# Patient Record
Sex: Male | Born: 1940 | Race: White | Hispanic: No | Marital: Married | Smoking: Never smoker
Health system: Southern US, Community
[De-identification: ages and names within clinical notes are randomized; demographics above are authoritative.]

## PROBLEM LIST (undated history)

## (undated) DIAGNOSIS — I712 Thoracic aortic aneurysm, without rupture, unspecified: Secondary | ICD-10-CM

## (undated) DIAGNOSIS — I10 Essential (primary) hypertension: Secondary | ICD-10-CM

## (undated) DIAGNOSIS — C61 Malignant neoplasm of prostate: Secondary | ICD-10-CM

## (undated) DIAGNOSIS — J45909 Unspecified asthma, uncomplicated: Secondary | ICD-10-CM

## (undated) DIAGNOSIS — K219 Gastro-esophageal reflux disease without esophagitis: Secondary | ICD-10-CM

## (undated) DIAGNOSIS — F419 Anxiety disorder, unspecified: Secondary | ICD-10-CM

## (undated) HISTORY — DX: Gastro-esophageal reflux disease without esophagitis: K21.9

## (undated) HISTORY — DX: Anxiety disorder, unspecified: F41.9

## (undated) HISTORY — DX: Thoracic aortic aneurysm, without rupture: I71.2

## (undated) HISTORY — DX: Thoracic aortic aneurysm, without rupture, unspecified: I71.20

## (undated) HISTORY — PX: CATARACT EXTRACTION W/ INTRAOCULAR LENS  IMPLANT, BILATERAL: SHX1307

## (undated) HISTORY — DX: Unspecified asthma, uncomplicated: J45.909

---
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
## 1986-11-21 HISTORY — PX: WISDOM TOOTH EXTRACTION: SHX21

## 2001-03-13 ENCOUNTER — Other Ambulatory Visit: Admission: RE | Admit: 2001-03-13 | Discharge: 2001-03-13 | Payer: Self-pay | Admitting: Urology

## 2001-03-13 ENCOUNTER — Encounter (INDEPENDENT_AMBULATORY_CARE_PROVIDER_SITE_OTHER): Payer: Self-pay

## 2002-11-21 HISTORY — PX: MOUTH SURGERY: SHX715

## 2003-05-01 ENCOUNTER — Encounter: Admission: RE | Admit: 2003-05-01 | Discharge: 2003-06-23 | Payer: Self-pay | Admitting: Family Medicine

## 2003-11-22 HISTORY — PX: PROSTATE SURGERY: SHX751

## 2003-11-22 HISTORY — PX: RETINAL DETACHMENT SURGERY: SHX105

## 2003-11-22 HISTORY — PX: RETROPUBIC PROSTATECTOMY: SUR1055

## 2004-06-10 ENCOUNTER — Ambulatory Visit (HOSPITAL_COMMUNITY): Admission: RE | Admit: 2004-06-10 | Discharge: 2004-06-11 | Payer: Self-pay | Admitting: Ophthalmology

## 2004-11-01 ENCOUNTER — Encounter (INDEPENDENT_AMBULATORY_CARE_PROVIDER_SITE_OTHER): Payer: Self-pay | Admitting: *Deleted

## 2004-11-01 ENCOUNTER — Inpatient Hospital Stay (HOSPITAL_COMMUNITY): Admission: RE | Admit: 2004-11-01 | Discharge: 2004-11-03 | Payer: Self-pay | Admitting: Urology

## 2007-11-22 HISTORY — PX: EYE SURGERY: SHX253

## 2007-12-05 ENCOUNTER — Ambulatory Visit (HOSPITAL_COMMUNITY): Admission: RE | Admit: 2007-12-05 | Discharge: 2007-12-05 | Payer: Self-pay | Admitting: Ophthalmology

## 2010-01-18 ENCOUNTER — Encounter: Payer: Self-pay | Admitting: Cardiology

## 2010-01-31 ENCOUNTER — Encounter: Payer: Self-pay | Admitting: Cardiology

## 2010-02-18 DIAGNOSIS — I10 Essential (primary) hypertension: Secondary | ICD-10-CM | POA: Insufficient documentation

## 2010-02-18 DIAGNOSIS — H3320 Serous retinal detachment, unspecified eye: Secondary | ICD-10-CM | POA: Insufficient documentation

## 2010-02-19 ENCOUNTER — Ambulatory Visit: Payer: Self-pay | Admitting: Cardiology

## 2010-02-19 DIAGNOSIS — E785 Hyperlipidemia, unspecified: Secondary | ICD-10-CM

## 2010-02-19 DIAGNOSIS — R072 Precordial pain: Secondary | ICD-10-CM | POA: Insufficient documentation

## 2010-03-04 ENCOUNTER — Ambulatory Visit (HOSPITAL_COMMUNITY): Admission: RE | Admit: 2010-03-04 | Discharge: 2010-03-04 | Payer: Self-pay | Admitting: Urology

## 2010-03-08 ENCOUNTER — Ambulatory Visit: Payer: Self-pay

## 2010-03-08 ENCOUNTER — Ambulatory Visit: Payer: Self-pay | Admitting: Cardiology

## 2010-03-09 ENCOUNTER — Ambulatory Visit: Admission: RE | Admit: 2010-03-09 | Discharge: 2010-06-07 | Payer: Self-pay | Admitting: Orthopedic Surgery

## 2010-03-25 ENCOUNTER — Ambulatory Visit: Payer: Self-pay | Admitting: Cardiology

## 2010-04-05 ENCOUNTER — Encounter: Payer: Self-pay | Admitting: Cardiology

## 2010-06-08 ENCOUNTER — Ambulatory Visit: Admission: RE | Admit: 2010-06-08 | Discharge: 2010-07-22 | Payer: Self-pay | Admitting: Radiation Oncology

## 2010-12-23 NOTE — Miscellaneous (Signed)
Summary: ambulatory BP monitor per Dr Julienne Kass  Clinical Lists Changes  Orders: Added new Referral order of Ambulatory Bloodpressure Cuff (Amb BP Cuff) - Signed

## 2010-12-23 NOTE — Assessment & Plan Note (Signed)
Summary: np6/chest pain/jml   Visit Type:  Follow-up Primary Provider:  Dr. Carmell Austria  CC:  chest pain.  History of Present Illness: The patient presents for new patient evaluation. He has no prior cardiac history. However, he does have cardiovascular risk factors. He has chest pain that has been going on for at least a year or more. This is sporadic. He doesn't necessarily come with exercise. However, up with exercise (he walks 2 miles almost every day), he might get this discomfort. He will typically feels somewhat short of breath with the sensation that he can't get a full breath. This has been a stable pattern. He has ascribed to his asthma in the past. However, now he is also getting some sensation of this at night in his house at times. He has this chest discomfort that feels like "restricted breathing". He does not describe neck or arm discomfort. He does not describe palpitations, presyncope or syncope. He does not have PND or orthopnea. He does not have swelling or weight gain. She has had some dyslipidemia which he treats with diet and has had improvement over the years. He does have mild glucose intolerance.  Current Medications (verified): 1)  Lisinopril-Hydrochlorothiazide 10-12.5 Mg Tabs (Lisinopril-Hydrochlorothiazide) .Marland Kitchen.. 1 By Mouth Daily 2)  Proair Hfa 108 (90 Base) Mcg/act Aers (Albuterol Sulfate) .... As Needed 3)  Flonase 50 Mcg/act Susp (Fluticasone Propionate) .... As Needed 4)  Claritin 10 Mg Tabs (Loratadine) .... As Needed 5)  Opcon-A 0.027-0.315 % Soln (Naphazoline-Pheniramine) .... 2-4 Times Daily  Allergies (verified): 1)  ! * Codiene  Past History:  Past Medical History:  1.  Hypertension.  2.  Seasonal allergies.  3.  Status post detached retina.  4.  GERD  5.  Prostate CA.  Past Surgical History: Status post repair of detached retina. Bone graft in the jaw Prostatectomy Cataract extraction (multiple eye surgeries)  Family History: Positive for  prostate cancer, hypertension, stomach cancer, leukemia.  There  was no early-onset heart disease. His mother died of heart failure at 29.  Vital Signs:  Patient profile:   70 year old male Height:      69 inches Weight:      192 pounds BMI:     28.46 Pulse rate:   68 / minute Resp:     18 per minute BP sitting:   168 / 70  (right arm)  Vitals Entered By: Marrion Coy, CNA (February 19, 2010 11:21 AM)  Physical Exam  General:  Well developed, well nourished, in no acute distress. Head:  normocephalic and atraumatic Eyes:  PERRLA/EOM intact; conjunctiva and lids normal. Mouth:  Teeth, gums and palate normal. Oral mucosa normal. Neck:  Neck supple, no JVD. No masses, thyromegaly or abnormal cervical nodes. Chest Wall:  no deformities or breast masses noted Lungs:  Clear bilaterally to auscultation and percussion. Abdomen:  Bowel sounds positive; abdomen soft and non-tender without masses, organomegaly, or hernias noted. No hepatosplenomegaly. Msk:  Back normal, normal gait. Muscle strength and tone normal. Extremities:  No clubbing or cyanosis. Neurologic:  Alert and oriented x 3. Skin:  Intact without lesions or rashes. Cervical Nodes:  no significant adenopathy Axillary Nodes:  no significant adenopathy Inguinal Nodes:  no significant adenopathy Psych:  Normal affect.   Detailed Cardiovascular Exam  Neck    Carotids: Carotids full and equal bilaterally without bruits.      Neck Veins: Normal, no JVD.    Heart    Inspection: no deformities or lifts  noted.      Palpation: normal PMI with no thrills palpable.      Auscultation: regular rate and rhythm, S1, S2 without murmurs, rubs, gallops, or clicks.    Vascular    Abdominal Aorta: no palpable masses, pulsations, or audible bruits.      Femoral Pulses: normal femoral pulses bilaterally.      Pedal Pulses: normal pedal pulses bilaterally.      Radial Pulses: normal radial pulses bilaterally.      Peripheral Circulation: no  clubbing, cyanosis, or edema noted with normal capillary refill.     EKG  Procedure date:  01/18/2010  Findings:      Normal sinus rhythm, rate 67, axis within normal limits, intervals within normal limits, no acute ST-T wave changes.  Impression & Recommendations:  Problem # 1:  CHEST PAIN, PRECORDIAL (ICD-786.51) The patient has chest pain that is somewhat atypical it maybe related more to asthma. However, he does have risk factors. Of note he had an elevated myeloperoxidase which can be an indicator of vascular disease. At this point I think screening with a plano exercise treadmill (POET) is reasonable as it will allow me to rule out obstructive coronary disease for which there was a low pretest probability, risk stratify and give him a prescription for exercise. Orders: Treadmill (Treadmill)  Problem # 2:  HYPERTENSION (ICD-401.9) His blood pressure is well controlled. He has recently had his meds changed and I think this is reasonable. He asked about taking his lisinopril HCT b.i.d. and I suggested not to because of the diuretic which he would not want to take later in the day.  Problem # 3:  DYSLIPIDEMIA (ICD-272.4) I reviewed his past and recent lipid profiles. He has been a very nice job over the years with diet bringing his LDL down. He still not below 100 however. I agree that he does not need pharmacologic management of this but I suggested further dietary changes that might help him reach an even better ratio. We will also discuss exercise at the time of his treadmill.  Patient Instructions: 1)  Your physician recommends that you schedule a follow-up appointment at the time of treadmill 2)  Your physician recommends that you continue on your current medications as directed. Please refer to the Current Medication list given to you today. 3)  Your physician has requested that you have an exercise tolerance test.  For further information please visit [email or web address]  Please  also follow instruction sheet, as given.

## 2010-12-23 NOTE — Letter (Signed)
Summary: [facility]  [facility]   Imported By: Kassie Mends 04/05/2010 08:09:30  _____________________________________________________________________  External Attachment:    Type:   Image     Comment:   External Document

## 2010-12-23 NOTE — Procedures (Signed)
Summary: summary  report  summary  report   Imported By: Mirna Mires 04/12/2010 14:54:15  _____________________________________________________________________  External Attachment:    Type:   Image     Comment:   External Document

## 2010-12-23 NOTE — Cardiovascular Report (Signed)
Summary: Ambulatory Blood Pressure Report   Ambulatory Blood Pressure Report   Imported By: Erle Crocker 04/23/2010 10:10:08  _____________________________________________________________________  External Attachment:    Type:   Image     Comment:   External Document

## 2011-04-05 NOTE — Op Note (Signed)
[name], [name]                ACCOUNT NO.:  [id number]   MEDICAL RECORD NO.:  [id number]          PATIENT TYPE:  AMB   LOCATION:  SDS                          FACILITY:  [facility]   PHYSICIAN:  Jillyn Hidden A. Rankin, M.D.   DATE OF BIRTH:  [birth year]   DATE OF PROCEDURE:  12/05/2007  DATE OF DISCHARGE:                               OPERATIVE REPORT   PREOPERATIVE DIAGNOSIS:  Rhegmatogenous retinal detachment, left eye,  macula on.   POSTOPERATIVE DIAGNOSIS:  Rhegmatogenous retinal detachment, left eye,  macula on.   PROCEDURES:  1. Scleral buckle using 287 explant.  2. Retinal cryopexy, left eye.   SURGEON:  Alford Highland. Rankin, M.D.   ANESTHESIA:  Local retrobulbar with monitored anesthesia control.   INDICATIONS FOR PROCEDURE:  The patient is a 70 year old man who has a  history of retinal detachment of the right eye, who presents with a  largely asymptomatic non-macula-involving but macula-threatening retinal  detachment superotemporally in the left eye.  The patient understands  this is an attempt to reattach the retina.  He understands the risks of  anesthesia including the rare occurrence of death and looses to the eye  including but not limited to hemorrhage, infection, scarring, need for  further surgery, no change in vision, loss of vision, progression of  disease despite intervention.  He understands the need for surgical  intervention to prevent vision loss and progression of the retinal  detachment.  The understands these issues and wishes to proceed.   The patient was taken to the operating room, where mild sedation was  introduced.  Marcaine 0.75% delivered retrobulbar 5 mL followed by an  additional 5 mL laterally in the fashion of modified Darel Hong.  The left  periocular region was sterilely prepped and draped in the usual  ophthalmic sterile fashion.  A lid speculum applied.  A conjunctival  peritomy was then fashioned from the 11 o'clock position temporally down  to  the 5 o'clock position.  The lateral rectus and superior rectus  muscles were isolated.  Notable findings of a Tenon's dissection  required over the surface of the lateral rectus as well as in the  superotemporal quadrant because of apparent scarring in this region of  the epibulbar tissues.   Nonetheless, ophthalmoscopy was performed.  Cryopexy was placed along  the 3 o'clock position along the border of what appeared to be an old  cryo scar from previous treatment, and also along the ora serrata in the  bed of the detachment up to the 1 o'clock position.  The extent of the  detachment was from 1 o'clock to 3 o'clock.   At 287 explant was selected and secured in this superotemporal quadrant  using 5-0 Mersilene mattress sutures.  With the passage of the suture,  spontaneous drainage of the fluid was carried out without difficulty.  Retinal cryopexy was placed in this area.   This allowed for excellent scleral indentation and placement of the  buckle.  The scleral buckle was tied in the appropriate height and  tension.  The edges were trimmed.  Conjunctiva and Tenon's  was brought  forward and closed after confirmatory ophthalmoscopy confirmed that the  retina was completely reattached with excellent scleral support.   At this time the bed of the conjunctiva and Tenon's was brought forward  and closed with interrupted 7-0 Vicryl.  Subconjunctival Decadron  applied.  A sterile patch and Fox shield applied.  The patient taken to  the PACU in good condition.      Alford Highland Rankin, M.D.  Electronically Signed     GAR/MEDQ  D:  12/05/2007  T:  12/05/2007  Job:  [id number]

## 2011-04-08 NOTE — H&P (Signed)
NAME:  [name], [name]                          ACCOUNT NO.:  [id number]   MEDICAL RECORD NO.:  [id number]                   PATIENT TYPE:  OIB   LOCATION:  5725                                 FACILITY:  [facility]   PHYSICIAN:  Guadelupe Sabin, M.D.             DATE OF BIRTH:  03-Feb-1941   DATE OF ADMISSION:  06/10/2004  DATE OF DISCHARGE:                                HISTORY & PHYSICAL   HISTORY OF PRESENT ILLNESS:  This was an urgent outpatient admission of this  70 year old, white male admitted with a rhegmatogenous retinal detachment of  the right eye.  This patient was in good visual health until a few days  prior to admission when he began to the note onset of floaters and a  progressive visual field defect in his right eye.  The patient called his  regular ophthalmologist, Dr. Glenford Peers, who referred the patient to my  office.  A diagnosis of a retinal detachment was made and arrangements made  for his outpatient admission at this time.   PAST MEDICAL HISTORY:  The patient is under the care of Dr. Quita Skye. Kindl  and takes several medications under his direction for hypertension including  lisinopril, Norvasc and hydrochlorothiazide.   ALLERGIES:  HYDROCODONE.   PHYSICAL EXAMINATION:  VITAL SIGNS:  As recorded.  GENERAL:  The patient is a pleasant, well-developed, well-nourished, white  male in acute ocular distress.  HEENT:  Visual acuity less than 20/400 in right eye, 20/25 in left eye.  Applanation tonometry normal both eyes.  Slit lamp examination shows the  eyes wide and clear with a clear cornea and clear lens.  Dilated fundus  examination with right eye revealing a relatively clear vitreous with a  bullous rhegmatogenous retinal detachment in the temporal quadrants.  There  are two round atrophic holes noted at the 9:30 o'clock position.  The  macular area is detached.  The optic nerve is sharply outlined of good color  with disc/cup ratio 0.3.  Blood vessels are  normal, macular detached.  Left  eye with clear vitreous, attached retina, normal optic nerve, blood vessels  and macula.  Refractive air, the patient is high myope in both eyes,  approximately 7.00 in each eye.  CHEST:  Lungs clear to auscultation and percussion.  HEART:  Normal sinus rhythm.  No cardiomegaly.  No murmurs.  ABDOMEN:  Negative.  EXTREMITIES:  Negative.   ASSESSMENT:  Rhegmatogenous retinal detachment, right eye, multiple tears.   PLAN:  Scleral buckling procedure with possible vitrectomy.  The patient has  been given, with his wife, printed material concerning retinal detachment  surgery and its possible complications, reoperations and failure to reattach  the retina.  They have elected to proceed to stay in [facility] for hospital  surgery at this time.  Guadelupe Sabin, M.D.    HNJ/MEDQ  D:  06/10/2004  T:  06/11/2004  Job:  [id number]

## 2011-04-08 NOTE — Op Note (Signed)
NAME:  [name], [name]                          ACCOUNT NO.:  [id number]   MEDICAL RECORD NO.:  [id number]                   PATIENT TYPE:  OIB   LOCATION:  5725                                 FACILITY:  [facility]   PHYSICIAN:  Guadelupe Sabin, M.D.             DATE OF BIRTH:  1941-11-20   DATE OF PROCEDURE:  06/10/2004  DATE OF DISCHARGE:                                 OPERATIVE REPORT   PREOPERATIVE DIAGNOSIS:  Rhegmatogenous retinal detachment right eye.   POSTOPERATIVE DIAGNOSIS:  Rhegmatogenous retinal detachment right eye.   PROCEDURE:  Scleral buckling procedure right eye using solid silicone  implants #277 and 240, cryo application, diathermy application, external  drainage of subretinal fluid.   SURGEON:  Guadelupe Sabin, M.D.   ASSISTANT:  Nurse.   ANESTHESIA:  General anesthesia.   OPHTHALMOSCOPY:  As previously described.   DESCRIPTION OF PROCEDURE:  After the patient was prepped and draped, lid  traction sutures were placed in the right upper and lower lids.  A lid  speculum was inserted.  A peritomy was performed adjacent to the limbus 360  degrees.  The subconjunctival tissue was cleaned and the rectus muscles  isolated with 4-0 silk traction sutures.  The sclera was inspected and felt  to be in satisfactory condition for lamellar scleral dissection on the  temporal quadrants.  Localization was then carried out with the indirect  ophthalmoscope and retinal cryoprobe.  The two retinal holes were located at  the 9:30 position, approximately 13 mm from the limbus.  The holes were  treated with direct cryo application.  It was then elected to perform a  lamellar scleral dissection from the 11 o'clock to 8 o'clock position.  The  bed measuring 9 mm in width.  Light cryo applications were applied.  A total  of three sutures were used to close the scleral flaps over a trimmed #277  solid silicone implant.  A #240 encircling band was placed about the globe,  tied with  two sutures at the 2 o'clock position.  Anchoring sutures were  placed at the 2 and 4 o'clock position of 5-0 white Dacron.  After repeat  indirect ophthalmoscopy, it was elected to drain fluid in the bed at the  8:30 position.  Incision was made through the inner scleral lamella.  The  choroid treated with light diathermy and then perforated with the pin  electrode.  An abundant amount of clear, slightly yellow tinged inviscid  fluid drained.  The scleral flaps were pulled up creating a scleral buckling  indentation and the tension of the encircling band adjusted.  Repeat  indirect ophthalmoscopy showed flattening of the retina on the implant  surface with good positioning at the holes.  It was therefore, elected to  close.  The tension of the encircling band was checked and the scleral flaps  secured permanently.  Tenon's capsule was pulled forward in the four  quadrants and tied as a separate layer with a 6-0 chromic catgut suture.  Neosporin was irrigated in the subtenon space.  The conjunctiva was pulled  forward and closed with a running 6-0 chromic catgut suture.  Depo-Garamycin  and dexamethasone were injected in the  subtenon space inferiorly.  Maxitrol and atropine ointment were instilled in  the conjunctival cul-de-sac.  Light patch and protector shield were applied  to the operated right eye.  Duration of procedure was one and a half hours.  The patient tolerated the procedure well in general and left the operating  room for the recovery room in good condition.                                               Guadelupe Sabin, M.D.    HNJ/MEDQ  D:  06/10/2004  T:  06/11/2004  Job:  [id number]   cc:   Delon Sacramento, M.D.  [address], [address]  [place]  [place] [address]  Fax: [phone]   Quita Skye. Artis Flock, M.D.  [address]  [place]  [place] [address]  Fax: [phone]

## 2011-04-08 NOTE — Op Note (Signed)
[name], [name]                ACCOUNT NO.:  [id number]   MEDICAL RECORD NO.:  [id number]          PATIENT TYPE:  INP   LOCATION:  0354                         FACILITY:  [facility]   PHYSICIAN:  Bertram Millard. Dahlstedt, M.D.DATE OF BIRTH:  Mar 24, 1941   DATE OF PROCEDURE:  11/01/2004  DATE OF DISCHARGE:                                 OPERATIVE REPORT   PREOPERATIVE DIAGNOSIS:  Prostate cancer (Gleason's score 4 + 3 = 7,  prostate-specific antigen equal 5).   POSTOPERATIVE DIAGNOSIS:  Prostate cancer (Gleason's score 4 + 3 = 7,  prostate-specific antigen equal 5).   PROCEDURE:  Radical retropubic prostatectomy with bilateral pelvic lymph  node dissection.   ATTENDING SURGEON:  Bertram Millard. Retta Diones, M.D.   RESIDENT SURGEON:  Rhae Lerner, M.D.   ANESTHESIA:  General endotracheal anesthesia.   COMPLICATIONS:  None.   ESTIMATED BLOOD LOSS:  1200 mL.   INDICATION FOR PROCEDURE:  Mr. Lange is a 70 year old male with a past  medical history significant for an abnormal digital rectal examination,  status post a negative workup and biopsy for prostate cancer in 2002.  Mr.  Shannahan was subsequently lost to followup, however, he was reevaluated in  October of 2005 for prostate cancer with a persistently abnormal rectal  examination and a PSA of 5.1.  Transrectal ultrasound-guided biopsy this  time demonstrated Gleason's 4 + 3 = 7 prostate cancer on the left side.  After a long discussion with Mr. Shealy concerning the implications of  prostate adenocarcinoma as well as possible treatment options, he has  elected to proceed with radical retropubic prostatectomy and bilateral  pelvic lymph node dissection.   DESCRIPTION OF THE PROCEDURE IN DETAIL:  Patient was brought to the  operating room and following induction of general endotracheal anesthesia,  he was placed in the supine position and prepped and draped in the usual  sterile fashion.  A 22-French Foley catheter was placed to  straight drain  and the bladder emptied.  A low midline incision was subsequently performed  from a point just below the umbilicus to a point immediately overlying the  pubic bone.  Dissection was carried down through the anterior rectus fascia.  The rectus muscles were separated and the space of Retzius entered sharply  with Metzenbaum scissors.  The incision was subsequently completely opened  along its entire length.  The space of Retzius was then completely developed  bluntly.  A Bookwalter retractor was placed at this point with the retractor  blade set for maximal exposure of the left pelvic lymph nodes.  A left  pelvic lymph node dissection was subsequently performed and the lymph node  packet was sent for pathology.  Several small veins were identified during  the dissection and these were carefully clipped and divided.  The retractor  blades were subsequently reset for exposure of the right pelvic lymph nodes.  A right pelvic lymphadenectomy was subsequently performed, again using a  combination of blunt dissection and careful clipping of small venous  structures.  The right lymph node packet was also sent for pathology.  Once  the lymphadenectomy had been completed, the retractor blades were reset for  maximal exposure of the anterior surface of the bladder and prostate.  All  adherent tissues were carefully teased away from the anterior surface of the  prostate.  The endopelvic fascia was then entered sharply on either side of  the prostate and the space developed between the lateral surface of the  prostate and the pelvic sidewall.  The puboprostatic ligaments were  identified and sharply divided using Metzenbaum scissors.  A Hohenfellner  clamp was subsequently passed between the anterior urethra and the overlying  dorsal venous complex at the apex of the prostate.  The clamp was  subsequently used to pass a #1 Vicryl through this space and the dorsal  venous complex was  ligated.  A second #1 Vicryl was passed through this  space in the same fashion and again used to ligated the dorsal venous  complex.  A #1 Vicryl suture was subsequently used to perform a stick tie on  the dorsal venous complex just proximal to the 2 previously placed ties.  A  2-0 Vicryl was subsequently used to place a figure-of-eight suture on the  anterior bladder neck for control of back-bleeding.  The dorsal venous  complex was subsequently divided using Bovie cautery, taking care to remain  distal to the apex of the prostate.  Once the dorsal venous complex had been  divided, a small hemorrhaging vessel was noted at the anterolateral aspect  of the dorsal venous complex on the right.  This was controlled with a 2-0  chromic figure-of-eight suture.  The anterior urethra was at this point  opened sharply using Metzenbaum scissors.  The Foley catheter was  subsequently identified within the urethra.  Metzenbaum scissors were used  to carefully tease the neurovascular bundles away from the urethra  bilaterally.  A right-angle clamp was subsequently passed posterior to the  urethra and used to bring a piece of umbilical tape completely around the  urethra.  The Foley catheter was subsequently apprehended using a Kelly  clamp, the catheter cut just proximal to the valve stem and the Foley  catheter brought into the operative field to use for superior retraction.  The posterior urethra was subsequently divided using Metzenbaum scissors.  At this point, a plane was developed between the posterior surface of the  prostate and the anterior surface of the rectum.  The rectourethralis muscle  was subsequently divided bilaterally by applying Hemoclips to the strands of  rectourethralis muscle and dividing the muscle using Metzenbaum scissors.  Once the posterior surface of the prostate had been completely dissected away from the anterior surface of the rectum, attention was turned to the   anterior bladder neck.  The bladder neck was carefully teased away from the  base of the prostate using a combination of blunt dissection with a  Vanderbilt and careful Bovie cautery.  The bladder neck was subsequently  opened anteriorly and the urine completely drained from the bladder.  The  Foley balloon was subsequently deflated and clamped together with the distal  end of the Foley to provide maximal retraction of the prostate.  The  posterior bladder neck was subsequently divided again using Vanderbilts to  tease apart the muscle fibers and careful division of muscle bundles using  Bovie cautery.  The ampullae of the vas deferens were identified, clipped  and divided.  The seminal vesicles were subsequently divided after  dissecting them away from the surrounding perirectal tissues.  Specimen was  subsequently delivered out of the operative field and sent for pathology.  The wound was subsequently irrigated and hemostasis obtained with placement  of a few additional Hemoclips and careful cauterization.  The bladder neck  was subsequently tailored using a tennis racket procedure with 4-0 chromic  suture.  The bladder mucosa was subsequently everted at the newly created  bladder neck.  The urethral anastomotic sutures were subsequently placed at  the 2 o'clock, 5 o'clock, 7 o'clock, 10 o'clock and 12 o'clock positions  using interrupted 2-0 Monocryl suture.  A new 22-French Foley catheter was  passed through the patient's urethra and brought into the pelvis.  The Foley  was subsequently placed within the bladder and balloon inflated.  Prior to  completing the anastomosis, final inspection of the operative field was made  and a small mucosal defect was noted in the bladder wall.  This was closed  with a single interrupted 2-0 chromic suture.  The bladder was subsequently  brought down adjacent to the urethra and the anastomotic sutures tied down  to complete the anastomosis.  All  retractor blades and lap sponges were  subsequently removed from the field and the wound irrigated.  A JP drain was  placed in the right lower quadrant through a separate stab incision and  sutured into place using a 3-0 nylon suture.  The rectus fascia was  subsequently reapproximated using a running #1 PDS suture.  The skin was  then reapproximated using 4-0 PDS subcuticular suture.  Benzoin and Steri-  Strips were applied to the suture line and the case was ended.  The patient  tolerated the procedure well and there were no complications.   COMMENT:  Please note that Dr. Marcine Matar was present for the entire  case and participated in all aspects of the procedure.     Jose   JJP/MEDQ  D:  11/01/2004  T:  11/01/2004  Job:  [id number]

## 2011-04-08 NOTE — H&P (Signed)
NAMEANGUEL, DELAPENA                ACCOUNT NO.:  [id number]   MEDICAL RECORD NO.:  [id number]          PATIENT TYPE:  INP   LOCATION:  NA                           FACILITY:  [facility]   PHYSICIAN:  Bertram Millard. Dahlstedt, M.D.DATE OF BIRTH:  [birth year]   DATE OF ADMISSION:  DATE OF DISCHARGE:                                HISTORY & PHYSICAL   CHIEF COMPLAINT:  Prostate cancer.   HISTORY OF PRESENT ILLNESS:  Mr. Paschal is a 70 year old gentleman with a  past medical history significant for a suspicious prostate nodule status  post negative biopsy in April of 2002.  Following this result, Mr. Deems was  lost to followup for several years; however, he presented for a PSA check  and digital rectal examination in October of 2005.  At that time, Mr. Feldner  continued to have a suspicious nodule on the left side of his prostate.  In  addition, his PSA was elevated at 5.3.  Due to these suspicious findings as  well as a positive family history of prostate cancer, Mr. 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  After discussing the situation with Mr. Zaugg and the  possible alternatives for treatment, he has elected to proceed with radical  retropubic prostatectomy.   REVIEW OF SYSTEMS:  The patient denies fevers, chills, nausea, vomiting,  diarrhea, constipation, chest pain, shortness of breath.  All other systems  are negative, except as above.   PAST MEDICAL HISTORY:  1.  Hypertension.  2.  Seasonal allergies.  3.  Status post detached retina.   PAST SURGICAL HISTORY:  Status post repair of detached retina.   SOCIAL HISTORY:  The patient is married.  He has five children.  He denies  tobacco use and occasional alcohol use.   FAMILY HISTORY:  Positive for prostate cancer, hypertension, stomach cancer,  heart disease, leukemia.   ALLERGIES:  NO KNOWN DRUG ALLERGIES.   MEDICATIONS:  1.  Norvasc.  2.  Lisinopril/hydrochlorothiazide.  3.  Advair.  4.  Albuterol.  5.  Antihistamines.  6.  Aspirin.   PHYSICAL EXAMINATION:  GENERAL:  The patient in no acute distress.  Alert  and oriented to person, place, and time.  HEENT:  Pupils are equal, round, and reactive to light.  Extraocular  movements intact.  NECK:  No masses.  HEART:  Regular rate and rhythm without murmurs.  LUNGS:  Clear to auscultation bilaterally.  ABDOMEN:  Soft, nontender, nondistended.  Bowel sounds positive.  EXTREMITIES:  No clubbing, cyanosis, or edema.  GU:  Normal, circumcised phallus.  No plaques or masses along the shaft of  the penis.  Testicles descended bilaterally.   ASSESSMENT AND PLAN:  A 70 year old male with biopsy-proven prostate cancer.  Will take Mr. Orihuela to the operating room at which time he will undergo an  open radical retropubic prostatectomy with bilateral pelvic lymphadenectomy.  Mr. Chaput understands the risks of this procedure and has elected  to  proceed.  We will plan to monitor him postoperatively for two to three days,  and he will be discharged home with a Foley catheter in place for  approximately 10 days.     Eric   EG/MEDQ  D:  11/01/2004  T:  11/01/2004  Job:  [id number]

## 2011-04-08 NOTE — Consult Note (Signed)
NAME:  [name], [name]                          ACCOUNT NO.:  [id number]   MEDICAL RECORD NO.:  [id number]                   PATIENT TYPE:  OIB   LOCATION:  5725                                 FACILITY:  [facility]   PHYSICIAN:  Hollice Espy, M.D.            DATE OF BIRTH:  03/27/1941   DATE OF CONSULTATION:  06/11/2004  DATE OF DISCHARGE:                                   CONSULTATION   REQUESTING PHYSICIAN:  Guadelupe Sabin, M.D., ophthalmology.   REASON FOR CONSULTATION:  Requesting consult is for hypotension and  bradycardia.   HISTORY OF PRESENT ILLNESS:  This is a 70 year old white male with past  medical history of hypertension, who was found to have a retinal detachment  of his right eye. The patient went to the OR and under Dr. Inda Coke course  he underwent a scleral buckling. The patient tolerated the procedure well.  Postoperatively coming off the anesthesia was having some mild nausea,  otherwise no complaints. On followup at approximately 9 p.m. he was noted to  have a slight low blood pressure of 95/40. The rest of his vitals were  checked and he was found to have a heart rate of 46. An EKG was checked and  the patient was found to be sinus bradycardic with a heart rate of 48. He  was feeling asymptomatic, denied any dizziness. The patient was started on  IV fluids which initially were at [facility] and increased to 100 mL an hour; he  tolerated this well. Lab work: The patient's potassium was noted to be at  3.6. An electrolyte panel was sent. The patient's blood pressure a few hours  later had improved to 128/58, the rest of his vitals remained stable and he  remained asymptomatic. Currently he is doing well, he does not have any  complaints. He denies any headaches, pain in his eyes, dysphagia, nausea,  vomiting, chest pain, palpitations, shortness of breath, wheeze, cough,  abdominal pain, hematuria, dysuria, constipation, diarrhea. He denies any  focal extremity  weakness.   PAST MEDICAL HISTORY:  1. Hypertension.  2. Asthma.   MEDICATIONS:  1. Norvasc 10 p.o. nightly.  2. Lisinopril/hydrochlorothiazide 10/12.5 p.o. daily.  3. Aspirin 81 daily.  4. Albuterol MDI as needed.  5. A vitamin E tablet here.   Here the patient is continuing to receive the rest of his medications. In  addition, he is on p.r.n. Demerol, p.r.n. Phenergan, phenylephrine 10% 1  drop OP. He is also on tropicamide 1 drop 1% OP. He is also on p.r.n.  Dilaudid, Zofran, TobraDex 1 drop OP 4 times a day, Cyclomydril 1 drop OP 4  times a day, and neomycin ophthalmic ointment nightly, as well as p.r.n.  Restoril and albuterol and Darvocet.   ALLERGIES:  The patient is allergic to HYDROCODONE.   SOCIAL HISTORY:  He occasionally drinks alcohol, and he denies any drugs or  tobacco use.  FAMILY HISTORY:  At this time is noncontributory.   PHYSICAL EXAMINATION:  VITAL SIGNS:  The patient's most recent vitals: Blood  pressure 114/56, heart rate 67, respirations 18, O2 saturation 93% on room  air, temperature is 98. Previous blood pressure was 94/50.  GENERAL:  He is alert and oriented x3, in no apparent distress.  HEENT:  He has a right eye patch.  NECK:  He has no carotid bruits.  HEART:  Regular rate and rhythm, S1 and S2.  LUNGS:  Clear to auscultation bilaterally.  ABDOMEN:  Soft, nontender, nondistended, positive bowel sounds.  EXTREMITIES:  No clubbing, cyanosis or edema.  NEUROLOGIC:  He has no focal neurological deficits. Cranial nerves were not  checked secondary to his eye patch.   LABORATORY WORK:  Preoperative labs: White count of 5.7, H&H 14.7 and 43.8,  platelet count 203. Sodium 140, potassium 3.6, chloride 108, bicarb 27, BUN  18, creatinine 1, glucose 122. LFTs are within normal limits. A repeat  electrolyte panel done July 21, evening: Sodium 139, potassium 3.7, chloride  110, bicarb 24. Chest x-ray is normal.   ASSESSMENT AND PLAN:  1. Slightly  decreased blood pressure earlier, improved with intravenous     fluids. Likely this is a combination of patient being on     antihypertensives, postoperative anemia and pain medications. Will resume     his p.o. antihypertensives tomorrow evening if his blood pressure is     above 110. Will discontinue his Demerol which can lower blood pressure.     He is already on p.r.n. Percocet.  2. Mild bradycardia. Looking at his EKG from preoperatively he likely runs     at a lower heart rate. He is not on any chronotropic agents such as beta-     blocker or Cardizem, but for now will not become further concerned unless     his heart rate should drop below 50.  3. Retinal detachment, status post buckle, as per attending.  4. Potassium. Given the fact the patient is on a diuretic, as long as his     potassium stays above 3.5 I would not recommend giving any additional     supplements. Will an ACE inhibitor strong doses of additional potassium     could raise this to a much higher level.  5. Asthma. The patient is on p.r.n. albuterol, he is asymptomatic.                                               Hollice Espy, M.D.    SKK/MEDQ  D:  06/11/2004  T:  06/11/2004  Job:  [id number]   cc:   Vikki Ports, M.D.  [address]  [place]  [place] [address]  Fax: [phone]   Guadelupe Sabin, M.D.  Fax: [phone]

## 2011-08-10 LAB — BASIC METABOLIC PANEL
CO2: 29
Calcium: 9.9
Creatinine, Ser: 0.96
GFR calc Af Amer: >60
GFR calc non Af Amer: >60
Sodium: 139

## 2011-08-10 LAB — CBC
HCT: 46.9
MCV: 93
WBC: 5.6

## 2011-10-21 ENCOUNTER — Other Ambulatory Visit: Payer: Self-pay | Admitting: Family Medicine

## 2013-09-12 ENCOUNTER — Other Ambulatory Visit: Payer: Self-pay | Admitting: Family Medicine

## 2013-09-12 ENCOUNTER — Other Ambulatory Visit (HOSPITAL_COMMUNITY): Payer: Self-pay | Admitting: Family Medicine

## 2013-09-12 ENCOUNTER — Ambulatory Visit (HOSPITAL_COMMUNITY)
Admission: RE | Admit: 2013-09-12 | Discharge: 2013-09-12 | Disposition: A | Discharge status: Home / Self Care | Payer: Medicare Other | Source: Ambulatory Visit | Attending: Vascular Surgery | Admitting: Vascular Surgery

## 2013-09-12 ENCOUNTER — Ambulatory Visit
Admission: RE | Admit: 2013-09-12 | Discharge: 2013-09-12 | Disposition: A | Discharge status: Home / Self Care | Payer: Medicare Other | Source: Ambulatory Visit | Attending: Family Medicine | Admitting: Family Medicine

## 2013-09-12 ENCOUNTER — Encounter (INDEPENDENT_AMBULATORY_CARE_PROVIDER_SITE_OTHER): Payer: Self-pay

## 2013-09-12 DIAGNOSIS — R42 Dizziness and giddiness: Secondary | ICD-10-CM

## 2013-09-12 DIAGNOSIS — J45909 Unspecified asthma, uncomplicated: Secondary | ICD-10-CM

## 2013-09-21 ENCOUNTER — Emergency Department (HOSPITAL_COMMUNITY)
Admission: EM | Admit: 2013-09-21 | Discharge: 2013-09-22 | Disposition: A | Discharge status: Home / Self Care | Payer: Medicare Other | Attending: Emergency Medicine | Admitting: Emergency Medicine

## 2013-09-21 ENCOUNTER — Emergency Department (HOSPITAL_COMMUNITY): Payer: Medicare Other

## 2013-09-21 ENCOUNTER — Encounter (HOSPITAL_COMMUNITY): Payer: Self-pay | Admitting: Emergency Medicine

## 2013-09-21 DIAGNOSIS — Z7982 Long term (current) use of aspirin: Secondary | ICD-10-CM | POA: Insufficient documentation

## 2013-09-21 DIAGNOSIS — N200 Calculus of kidney: Secondary | ICD-10-CM | POA: Insufficient documentation

## 2013-09-21 DIAGNOSIS — I10 Essential (primary) hypertension: Secondary | ICD-10-CM | POA: Insufficient documentation

## 2013-09-21 DIAGNOSIS — Z79899 Other long term (current) drug therapy: Secondary | ICD-10-CM | POA: Insufficient documentation

## 2013-09-21 DIAGNOSIS — Z8546 Personal history of malignant neoplasm of prostate: Secondary | ICD-10-CM | POA: Insufficient documentation

## 2013-09-21 HISTORY — DX: Malignant neoplasm of prostate: C61

## 2013-09-21 HISTORY — DX: Essential (primary) hypertension: I10

## 2013-09-21 LAB — URINALYSIS, ROUTINE W REFLEX MICROSCOPIC
Glucose, UA: 250 mg/dL — AB
Ketones, ur: NEGATIVE mg/dL
Nitrite: NEGATIVE
Protein, ur: 30 mg/dL — AB
Urobilinogen, UA: 0.2 mg/dL (ref 0.0–1.0)
pH: 7.5 (ref 5.0–8.0)

## 2013-09-21 LAB — POCT I-STAT, CHEM 8
BUN: 19 mg/dL (ref 6–23)
HCT: 46 % (ref 39.0–52.0)
Sodium: 141 mEq/L (ref 135–145)

## 2013-09-21 LAB — COMPREHENSIVE METABOLIC PANEL
BUN: 19 mg/dL (ref 6–23)
CO2: 25 mEq/L (ref 19–32)
Creatinine, Ser: 0.98 mg/dL (ref 0.50–1.35)
GFR calc non Af Amer: 80 mL/min — ABNORMAL LOW (ref >90)
Glucose, Bld: 210 mg/dL — ABNORMAL HIGH (ref 70–99)
Potassium: 3.4 mEq/L — ABNORMAL LOW (ref 3.5–5.1)
Sodium: 137 mEq/L (ref 135–145)
Total Protein: 6.9 g/dL (ref 6.0–8.3)

## 2013-09-21 LAB — CBC WITH DIFFERENTIAL/PLATELET
Basophils Relative: 0 % (ref 0–1)
Eosinophils Absolute: 0.1 10*3/uL (ref 0.0–0.7)
Eosinophils Relative: 1 % (ref 0–5)
HCT: 42.2 % (ref 39.0–52.0)
Hemoglobin: 14.7 g/dL (ref 13.0–17.0)
MCHC: 34.8 g/dL (ref 30.0–36.0)
Monocytes Absolute: 1 10*3/uL (ref 0.1–1.0)
Neutro Abs: 9 10*3/uL — ABNORMAL HIGH (ref 1.7–7.7)
Platelets: 186 10*3/uL (ref 150–400)
RDW: 13.2 % (ref 11.5–15.5)

## 2013-09-21 LAB — LIPASE, BLOOD: Lipase: 22 U/L (ref 11–59)

## 2013-09-21 LAB — URINE MICROSCOPIC-ADD ON

## 2013-09-21 MED ORDER — MORPHINE SULFATE 4 MG/ML IJ SOLN
4.0000 mg | Freq: Once | INTRAMUSCULAR | Status: AC
  Administered 2013-09-22: 4 mg via INTRAVENOUS
  Filled 2013-09-21: qty 1

## 2013-09-21 MED ORDER — ONDANSETRON HCL 4 MG/2ML IJ SOLN
4.0000 mg | Freq: Once | INTRAMUSCULAR | Status: AC
  Administered 2013-09-22: 4 mg via INTRAVENOUS
  Filled 2013-09-21: qty 2

## 2013-09-21 NOTE — ED Notes (Signed)
Per EMS , pt. From home with complaint of left flank pain which started @7pm  this evening @10 /10. Pt.took hydrocodone 1 tab , pain decreased to 4 but was nauseous and vomiting. Pt. Received 4mg  of IV Zoran and of fentanyl by EMS. Pt. Denies pain upon arrival. Alert and oriented x4.  Pt. Claimed of having kidney stone history.

## 2013-09-21 NOTE — ED Provider Notes (Signed)
CSN: [id number]     Arrival date & time 09/21/13  2148 History   First MD Initiated Contact with Patient 09/21/13 2251     Chief Complaint  Patient presents with  . Flank Pain    HPI  [name] is a 72 y.o. male with a PMH of prostate cancer and HTN who presents to the ED for evaluation of flank pain.  History was provided by the patient and his wife.  Pain started around 6:00 PM and has gradually worsened.  Patient is located in the left lower quadrant and radiates up his left flank.  Nausea with three episodes of emesis.  No hematemesis.  He states that his pain comes and goes and is described as a stabbing sensation.  Nothing makes his pain better or worse.  He took his wife's pain medication (hydrocodone) which "took the edge off."  He states he had similar pain when he was going through his prostate cancer treatment.  He denies any diarrhea, constipation, fevers, dysuria, or back pain.  He noticed a small amount of blood tinged in his urine.  No gross hematemesis or clots.  He has otherwise been well.  No known sick contacts.  No recent travel. Patient states he ate a "lot of Halloween candy last night."  Patient received fentanyl and zofran in route to the hospital, which helped improve his symptoms.  He has a history of kidney stones several years ago.     Past Medical History  Diagnosis Date  . Prostate cancer   . Hypertension    Past Surgical History  Procedure Laterality Date  . Prostate surgery    . Retinal detachment surgery     History reviewed. No pertinent family history. History  Substance Use Topics  . Smoking status: Never Smoker   . Smokeless tobacco: Not on file  . Alcohol Use: No    Review of Systems  Constitutional: Negative for fever, chills, activity change, appetite change and fatigue.  HENT: Negative for congestion, rhinorrhea and sore throat.   Eyes: Negative for visual disturbance.  Respiratory: Negative for cough and shortness of breath.    Gastrointestinal: Positive for nausea, vomiting and abdominal pain. Negative for diarrhea, constipation, blood in stool and rectal pain.  Genitourinary: Positive for hematuria and flank pain. Negative for dysuria, urgency, frequency, scrotal swelling, difficulty urinating, penile pain and testicular pain.  Musculoskeletal: Negative for back pain.  Skin: Negative for wound.  Neurological: Negative for dizziness, weakness and headaches.    Allergies  Codeine  Home Medications   Current Outpatient Rx  Name  Route  Sig  Dispense  Refill  . amLODipine (NORVASC) 5 MG tablet   Oral   Take 5 mg by mouth daily.          Christie Beckers 60 METERED DOSES 220 MCG/INH inhaler   Inhalation   Inhale 2 puffs into the lungs 2 (two) times daily.          Marland Kitchen aspirin 81 MG chewable tablet   Oral   Chew 81 mg by mouth daily.         . cholecalciferol (VITAMIN D) 1000 UNITS tablet   Oral   Take 2,000 Units by mouth daily.         . fluticasone (FLONASE) 50 MCG/ACT nasal spray   Nasal   Place 1 spray into the nose as needed for allergies.          Marland Kitchen HYDROcodone-acetaminophen (NORCO/VICODIN) 5-325 MG per tablet  Oral   Take 1 tablet by mouth every 6 (six) hours as needed for pain.         Marland Kitchen losartan (COZAAR) 100 MG tablet   Oral   Take 100 mg by mouth daily.          Marland Kitchen PROAIR HFA 108 (90 BASE) MCG/ACT inhaler   Inhalation   Inhale 2 puffs into the lungs every 4 (four) hours as needed for shortness of breath.           BP 118/60  Pulse 56  Temp(Src) 98.1 F (36.7 C) (Oral)  Resp 18  SpO2 100%  Filed Vitals:   09/21/13 2149 09/21/13 2219 09/22/13 0242  BP:  118/60 155/78  Pulse:  56 65  Temp:  98.1 F (36.7 C) 98.8 F (37.1 C)  TempSrc:  Oral Oral  Resp:  18 19  SpO2: 100% 100% 98%   Physical Exam  Nursing note and vitals reviewed. Constitutional: He is oriented to person, place, and time. He appears well-developed and well-nourished. No distress.  HENT:  Head:  Normocephalic and atraumatic.  Right Ear: External ear normal.  Left Ear: External ear normal.  Eyes: Conjunctivae are normal. Right eye exhibits no discharge. Left eye exhibits no discharge.  Neck: Normal range of motion. Neck supple.  Cardiovascular: Normal rate, regular rhythm, normal heart sounds and intact distal pulses.  Exam reveals no gallop and no friction rub.   No murmur heard. Dorsalis pedis pulses present bilaterally  Pulmonary/Chest: Effort normal and breath sounds normal. No respiratory distress. He has no wheezes. He has no rales. He exhibits no tenderness.  Abdominal: Soft. Bowel sounds are normal. He exhibits no distension and no mass. There is no tenderness. There is no rebound and no guarding.  Musculoskeletal: Normal range of motion. He exhibits no edema and no tenderness.  No CVA tenderness bilaterally.  No tenderness to palpation to the lower lumbar region diffusely  Neurological: He is alert and oriented to person, place, and time.  Skin: Skin is warm and dry. He is not diaphoretic.    ED Course  Procedures (including critical care time) Labs Review Labs Reviewed  CBC WITH DIFFERENTIAL - Abnormal; Notable for the following:    WBC 12.7 (*)    Neutro Abs 9.0 (*)    All other components within normal limits  POCT I-STAT, CHEM 8 - Abnormal; Notable for the following:    Glucose, Bld 204 (*)    Calcium, Ion 1.12 (*)    All other components within normal limits  URINALYSIS, ROUTINE W REFLEX MICROSCOPIC   Imaging Review No results found.  EKG Interpretation   None      Results for orders placed during the hospital encounter of 09/21/13  CBC WITH DIFFERENTIAL      Result Value Range   WBC 12.7 (*) 4.0 - 10.5 K/uL   RBC 4.55  4.22 - 5.81 MIL/uL   Hemoglobin 14.7  13.0 - 17.0 g/dL   HCT 04.5  40.9 - 81.1 %   MCV 92.7  78.0 - 100.0 fL   MCH 32.3  26.0 - 34.0 pg   MCHC 34.8  30.0 - 36.0 g/dL   RDW 91.4  78.2 - 95.6 %   Platelets 186  150 - 400 K/uL    Neutrophils Relative % 71  43 - 77 %   Neutro Abs 9.0 (*) 1.7 - 7.7 K/uL   Lymphocytes Relative 20  12 - 46 %   Lymphs Abs 2.5  0.7 - 4.0 K/uL   Monocytes Relative 8  3 - 12 %   Monocytes Absolute 1.0  0.1 - 1.0 K/uL   Eosinophils Relative 1  0 - 5 %   Eosinophils Absolute 0.1  0.0 - 0.7 K/uL   Basophils Relative 0  0 - 1 %   Basophils Absolute 0.0  0.0 - 0.1 K/uL  URINALYSIS, ROUTINE W REFLEX MICROSCOPIC      Result Value Range   Color, Urine YELLOW  YELLOW   APPearance CLOUDY (*) CLEAR   Specific Gravity, Urine 1.020  1.005 - 1.030   pH 7.5  5.0 - 8.0   Glucose, UA 250 (*) NEGATIVE mg/dL   Hgb urine dipstick LARGE (*) NEGATIVE   Bilirubin Urine NEGATIVE  NEGATIVE   Ketones, ur NEGATIVE  NEGATIVE mg/dL   Protein, ur 30 (*) NEGATIVE mg/dL   Urobilinogen, UA 0.2  0.0 - 1.0 mg/dL   Nitrite NEGATIVE  NEGATIVE   Leukocytes, UA NEGATIVE  NEGATIVE  COMPREHENSIVE METABOLIC PANEL      Result Value Range   Sodium 137  135 - 145 mEq/L   Potassium 3.4 (*) 3.5 - 5.1 mEq/L   Chloride 100  96 - 112 mEq/L   CO2 25  19 - 32 mEq/L   Glucose, Bld 210 (*) 70 - 99 mg/dL   BUN 19  6 - 23 mg/dL   Creatinine, Ser 1.61  0.50 - 1.35 mg/dL   Calcium 9.5  8.4 - 09.6 mg/dL   Total Protein 6.9  6.0 - 8.3 g/dL   Albumin 3.9  3.5 - 5.2 g/dL   AST 19  0 - 37 U/L   ALT 21  0 - 53 U/L   Alkaline Phosphatase 109  39 - 117 U/L   Total Bilirubin 0.4  0.3 - 1.2 mg/dL   GFR calc non Af Amer 80 (*) >90 mL/min   GFR calc Af Amer >90  >90 mL/min  LIPASE, BLOOD      Result Value Range   Lipase 22  11 - 59 U/L  URINE MICROSCOPIC-ADD ON      Result Value Range   RBC / HPF TOO NUMEROUS TO COUNT  <3 RBC/hpf   Urine-Other AMORPHOUS URATES/PHOSPHATES    POCT I-STAT, CHEM 8      Result Value Range   Sodium 141  135 - 145 mEq/L   Potassium 3.5  3.5 - 5.1 mEq/L   Chloride 105  96 - 112 mEq/L   BUN 19  6 - 23 mg/dL   Creatinine, Ser 0.45  0.50 - 1.35 mg/dL   Glucose, Bld 409 (*) 70 - 99 mg/dL   Calcium, Ion 8.11  (*) 1.13 - 1.30 mmol/L   TCO2 23  0 - 100 mmol/L   Hemoglobin 15.6  13.0 - 17.0 g/dL   HCT 91.4  78.2 - 95.6 %      CT Abdomen Pelvis Wo Contrast (Final result)  Result time: 09/22/13 00:27:19    Final result by Rad Results In Interface (09/22/13 00:27:19)    Narrative:   CLINICAL DATA: Flank pain, history prostate cancer.  EXAM: CT ABDOMEN AND PELVIS WITHOUT CONTRAST  TECHNIQUE: Multidetector CT imaging of the abdomen and pelvis was performed following the standard protocol without intravenous contrast.  COMPARISON: CT of the abdomen and pelvis February 23, 2010. Whole body bone scan March 04, 2010.  FINDINGS: Limited view of the lung bases remain clear. Included heart and pericardium are unremarkable.  Kidneys are well  located, moderate left hydroureteronephrosis to the level of the mid left ureter where a 3.4 mm calculus is seen. Mild left perinephric stranding is new suggests inflammatory changes without focal fluid collection. No residual nephrolithiasis. No right hydronephrosis. Urinary bladder is well distended, and unremarkable.  At least 2 gallstones again seen, measuring up to 19 mm without CT findings of acute cholecystitis. The liver, spleen, pancreas and adrenal glands are unremarkable for this nonenhanced examination. Aorta is normal in course and caliber with moderate calcific atherosclerosis.  Small hiatal hernia. Stomach, small and large bowel are normal in course and caliber without inflammatory changes of, sensitivity may be decreased by lack of enteric contrast. Normal appendix. No intraperitoneal free fluid nor free air.  Status post prostatectomy. Asymmetric nodularity of the right seminal vesicle again noted, previously measuring 23 x 14 mm, now 19 x 13 mm.  Fat containing inguinal hernias. Focal rounded sclerosis within the anterior inferior L3 vertebral body appears new, concerning for metastatic disease. Subcentimeter sclerotic lesion in the  left pubic body is unchanged more consistent with a bone island.  IMPRESSION: Moderate left hydroureteronephrosis to the level of mid left ureter where a 3.4 mm calculus is seen. No residual nephrolithiasis.  Status post prostatectomy with slightly smaller right seminal vessel nodule. However, new sclerotic lesion within the L3 vertebral body may reflect metastatic disease, less likely discogenic endplate changes. Consider repeat bone scan as clinically indicated.   Electronically Signed By: Awilda Metro On: 09/22/2013 00:27    MDM   1. Kidney stone     [name] is a 72 y.o. male with a PMH of prostate cancer and HTN who presents to the ED for evaluation of flank pain.  UA, i-stat-8, CBC, CMP and lipase ordered to further evaluate.  CT abdomen and pelvis ordered to evaluate for kidney stones.  1L normal saline ordered.     Rechecks  11:30 PM = Pain and nausea returning.  Morphine and zofran ordered.   1:31 AM = Informed patient of results.  Pain improved after morphine and zofran but is now returning.  Percocet, zofran, and toradol ordered.  Patient wants to try percocet in the ED to see if it will control his pain at home.     Etiology of abdominal and flank pain likely due to a kidney stone.  CT shows a 3.4 mm calculus with a moderate left hydronephrosis.  Urine with large blood.  Creatinine WNL.  Patient afebrile.  Patient was informed of the results of his incidental finding on CT and instructed to follow-up as soon as possible.   1:31 PM = signed out care to [facility] PA-C who will continue to monitor patient and discharge patient if pain and nausea controlled.    Final impressions: 1. Kidney stone  2. Vertebral lesion, L3     Luiz Iron PA-C   This patient was discussed with Dr. Gwendolyn Grant    EPIC downtime occurred during this visit        Jillyn Ledger, PA-C 09/23/13 1132

## 2013-09-21 NOTE — ED Notes (Signed)
Bed: ZO10 Expected date:  Expected time:  Means of arrival:  Comments: EMS/abdominal and flank pain

## 2013-09-22 MED ORDER — OXYCODONE-ACETAMINOPHEN 5-325 MG PO TABS
ORAL_TABLET | ORAL | Status: AC
  Administered 2013-09-22: 02:00:00
  Filled 2013-09-22: qty 2

## 2013-09-22 MED ORDER — ONDANSETRON HCL 4 MG/2ML IJ SOLN
INTRAMUSCULAR | Status: AC
  Administered 2013-09-22: 02:00:00
  Filled 2013-09-22: qty 2

## 2013-09-22 MED ORDER — KETOROLAC TROMETHAMINE 30 MG/ML IJ SOLN
INTRAMUSCULAR | Status: AC
  Administered 2013-09-22: 02:00:00
  Filled 2013-09-22: qty 1

## 2013-09-22 MED ORDER — HYDROMORPHONE HCL PF 1 MG/ML IJ SOLN: 1.0000 mg | Freq: Once | INTRAMUSCULAR | Status: DC

## 2013-09-22 NOTE — ED Provider Notes (Signed)
Medical screening examination/treatment/procedure(s) were conducted as a shared visit with non-physician practitioner(s) and myself.  I personally evaluated the patient during the encounter.  EKG Interpretation   None        21M here with L flank pain. Radiating to LLQ. N/V also. Atraumatic. Bedside US performed by me shows no aneurysm of aorta in proximal region. Unable to obtain clear views in lower abdomen. No hx of AAA. Pain location and history is c/w stone. CT shows L ureteral stone. Given pain meds, nausea meds, flomax, and Urology f/u.   Dagmar Hait, MD 09/22/13 [phone]

## 2013-09-22 NOTE — ED Notes (Signed)
Patient transported to CT 

## 2013-09-22 NOTE — ED Provider Notes (Signed)
Patient care assumed from Coral Ceo, PA-C at shift change. Patient presented today for flank pain and was found to have a left 4 mm kidney stone. 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 Currently followed by an oncologist. Plan at shift change: pain control with plan for urology follow up.  1:25 - Patient with mild improvement in symptoms. Only received additional medications a few minutes ago. Will reassess in 30-45 min.  2:30 - Patient states that his pain and nausea are both well controlled at this time. Patient feels comfortable managing symptoms further at home. Patient hemodynamically stable and appropriate for d/c with urology follow up. Rx for percocet and zofran given for symptom control. Return precautions discussed and patient agreeable to plan with no unaddressed concerns.  Antony Madura, PA-C 09/22/13 [phone]

## 2013-09-25 NOTE — ED Provider Notes (Signed)
Medical screening examination/treatment/procedure(s) were conducted as a shared visit with non-physician practitioner(s) and myself.  I personally evaluated the patient during the encounter.  EKG Interpretation   None        Patient here with L flank pain, prior history of kidney stone. Pain sharp, crampy, radiating to L groin. AFVSS, L flank with mild tenderness, no CVA tenderness, no other abdominal pain. CT with stone, given Urology f/u. 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  Dagmar Hait, MD 09/25/13 [phone]

## 2013-10-02 ENCOUNTER — Other Ambulatory Visit: Payer: Self-pay | Admitting: Urology

## 2013-10-02 DIAGNOSIS — C61 Malignant neoplasm of prostate: Secondary | ICD-10-CM

## 2013-10-18 ENCOUNTER — Encounter (HOSPITAL_COMMUNITY): Payer: Medicare Other

## 2013-10-18 ENCOUNTER — Ambulatory Visit (HOSPITAL_COMMUNITY): Payer: Medicare Other

## 2013-10-24 ENCOUNTER — Encounter (HOSPITAL_COMMUNITY)
Admission: RE | Admit: 2013-10-24 | Discharge: 2013-10-24 | Disposition: A | Discharge status: Home / Self Care | Payer: Medicare Other | Source: Ambulatory Visit | Attending: Urology | Admitting: Urology

## 2013-10-24 DIAGNOSIS — C61 Malignant neoplasm of prostate: Secondary | ICD-10-CM

## 2013-10-24 DIAGNOSIS — M948X9 Other specified disorders of cartilage, unspecified sites: Secondary | ICD-10-CM | POA: Insufficient documentation

## 2013-10-24 MED ORDER — TECHNETIUM TC 99M MEDRONATE IV KIT
25.0000 | PACK | Freq: Once | INTRAVENOUS | Status: AC | PRN
  Administered 2013-10-24: 25 via INTRAVENOUS

## 2013-11-05 ENCOUNTER — Other Ambulatory Visit (HOSPITAL_COMMUNITY): Payer: Self-pay | Admitting: Family Medicine

## 2013-11-05 DIAGNOSIS — R079 Chest pain, unspecified: Secondary | ICD-10-CM

## 2013-11-11 ENCOUNTER — Encounter (HOSPITAL_COMMUNITY): Payer: Self-pay

## 2013-11-11 ENCOUNTER — Ambulatory Visit (HOSPITAL_COMMUNITY)
Admission: RE | Admit: 2013-11-11 | Discharge: 2013-11-11 | Disposition: A | Discharge status: Home / Self Care | Payer: Medicare Other | Source: Ambulatory Visit | Attending: Family Medicine | Admitting: Family Medicine

## 2013-11-11 VITALS — BP 147/78 | HR 55

## 2013-11-11 DIAGNOSIS — R079 Chest pain, unspecified: Secondary | ICD-10-CM

## 2013-11-11 DIAGNOSIS — R0789 Other chest pain: Secondary | ICD-10-CM | POA: Insufficient documentation

## 2013-11-11 DIAGNOSIS — R0602 Shortness of breath: Secondary | ICD-10-CM | POA: Insufficient documentation

## 2013-11-11 MED ORDER — METOPROLOL TARTRATE 1 MG/ML IV SOLN
INTRAVENOUS | Status: AC
  Filled 2013-11-11: qty 5

## 2013-11-11 MED ORDER — NITROGLYCERIN 0.4 MG SL SUBL
SUBLINGUAL_TABLET | SUBLINGUAL | Status: AC
  Filled 2013-11-11: qty 25

## 2013-11-11 MED ORDER — IOHEXOL 300 MG/ML  SOLN: 80.0000 mL | Freq: Once | INTRAMUSCULAR | Status: DC | PRN

## 2013-11-11 MED ORDER — METOPROLOL TARTRATE 50 MG PO TABS
ORAL_TABLET | ORAL | Status: AC
  Administered 2013-11-11: 50 mg via ORAL
  Filled 2013-11-11: qty 1

## 2013-11-11 MED ORDER — METOPROLOL TARTRATE 25 MG/10 ML ORAL SUSPENSION: 50.0000 mg | Freq: Once | ORAL | Status: DC

## 2013-11-11 MED ORDER — METOPROLOL TARTRATE 50 MG PO TABS
50.0000 mg | ORAL_TABLET | Freq: Once | ORAL | Status: DC
  Filled 2013-11-11: qty 1

## 2013-11-11 MED ORDER — IOHEXOL 350 MG/ML SOLN
80.0000 mL | Freq: Once | INTRAVENOUS | Status: AC | PRN
  Administered 2013-11-11: 80 mL via INTRAVENOUS

## 2013-11-11 MED ORDER — NITROGLYCERIN 0.4 MG SL SUBL
0.4000 mg | SUBLINGUAL_TABLET | SUBLINGUAL | Status: DC | PRN
  Administered 2013-11-11: 0.4 mg via SUBLINGUAL
  Filled 2013-11-11: qty 25

## 2013-11-11 MED ORDER — METOPROLOL TARTRATE 1 MG/ML IV SOLN
5.0000 mg | Freq: Once | INTRAVENOUS | Status: AC
  Administered 2013-11-11: 5 mg via INTRAVENOUS
  Filled 2013-11-11: qty 5

## 2014-09-12 ENCOUNTER — Other Ambulatory Visit: Payer: Self-pay | Admitting: Family Medicine

## 2014-09-12 ENCOUNTER — Ambulatory Visit
Admission: RE | Admit: 2014-09-12 | Discharge: 2014-09-12 | Disposition: A | Discharge status: Home / Self Care | Payer: Medicare Other | Source: Ambulatory Visit | Attending: Family Medicine | Admitting: Family Medicine

## 2014-09-12 DIAGNOSIS — R0602 Shortness of breath: Secondary | ICD-10-CM

## 2014-12-11 ENCOUNTER — Other Ambulatory Visit: Payer: Self-pay | Admitting: Family Medicine

## 2014-12-11 DIAGNOSIS — I77810 Thoracic aortic ectasia: Secondary | ICD-10-CM

## 2014-12-16 ENCOUNTER — Ambulatory Visit
Admission: RE | Admit: 2014-12-16 | Discharge: 2014-12-16 | Disposition: A | Discharge status: Home / Self Care | Payer: Medicare Other | Source: Ambulatory Visit | Attending: Family Medicine | Admitting: Family Medicine

## 2014-12-16 DIAGNOSIS — I77810 Thoracic aortic ectasia: Secondary | ICD-10-CM

## 2014-12-16 MED ORDER — IOHEXOL 300 MG/ML  SOLN
75.0000 mL | Freq: Once | INTRAMUSCULAR | Status: AC | PRN
  Administered 2014-12-16: 75 mL via INTRAVENOUS

## 2014-12-25 ENCOUNTER — Encounter: Payer: Self-pay | Admitting: *Deleted

## 2014-12-25 DIAGNOSIS — I712 Thoracic aortic aneurysm, without rupture, unspecified: Secondary | ICD-10-CM | POA: Insufficient documentation

## 2014-12-25 DIAGNOSIS — K219 Gastro-esophageal reflux disease without esophagitis: Secondary | ICD-10-CM | POA: Insufficient documentation

## 2014-12-25 DIAGNOSIS — F419 Anxiety disorder, unspecified: Secondary | ICD-10-CM | POA: Insufficient documentation

## 2014-12-25 DIAGNOSIS — J45909 Unspecified asthma, uncomplicated: Secondary | ICD-10-CM | POA: Insufficient documentation

## 2014-12-30 ENCOUNTER — Institutional Professional Consult (permissible substitution) (INDEPENDENT_AMBULATORY_CARE_PROVIDER_SITE_OTHER): Payer: Medicare Other | Admitting: Thoracic Surgery (Cardiothoracic Vascular Surgery)

## 2014-12-30 ENCOUNTER — Encounter: Payer: Self-pay | Admitting: Thoracic Surgery (Cardiothoracic Vascular Surgery)

## 2014-12-30 VITALS — BP 157/72 | HR 67 | Resp 16 | Ht 69.0 in | Wt 185.0 lb

## 2014-12-30 DIAGNOSIS — I712 Thoracic aortic aneurysm, without rupture, unspecified: Secondary | ICD-10-CM

## 2014-12-30 NOTE — Progress Notes (Signed)
PCP is Marylene Land, MD Referring Provider is Blomgren, Doretha Sou, MD  Chief Complaint  Patient presents with  . TAA    per CT    HPI: 74 year old man sent for consultation regarding a 4.8 cm ascending aortic aneurysm.  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 In December 2014 he was having some issues with shortness of breath and chest tightness with exertion. He had a CT of the coronaries which showed some plaque but no hemodynamically significant stenoses. He was noted to have a "4.4 cm" ascending aneurysm.  He recently saw Dr. Sandi Mariscal for an annual checkup. He recommended a CT angios of the chest to check on the ascending aneurysm. That was done on January 26. It showed an of 4.8 cm ascending aneurysm. There was significant calcified atherosclerotic plaque in the arch and descending aorta but they were not aneurysmal.  Mr. Wik says that he has tightness or pressure in his chest particularly when he walks up an incline. He feels like this is been getting worse recently. He's not sure of its from his asthma or from his heart. He has had asthma since childhood. Is also concerned because he started that Symbicort can cause heart attacks. Says his been under a great deal of stress lately could is getting ready to move to [facility]. He saw a cardiologist about 4 years ago. He has not seen one since, but as noted he did have a coronary CT a year ago.   Past Medical History  Diagnosis Date  . Hypertension   . Thoracic aortic aneurysm without rupture   . GERD (gastroesophageal reflux disease)   . Anxiety   . Prostate cancer 2005/2011    RADIATION  . Asthma     CHRONIC PERSISTENT    Past Surgical History  Procedure Laterality Date  . Retinal detachment surgery Right 2005  . Tonsillectomy  1948  . Wisdom tooth extraction  1988    AND EXC. OF ODONTOGENIC CYST  . Mouth surgery  2004   ALVEOLAR RIDGE BONE GRAFT FOR CORRECTION OF GING. RECESSION  . Cataract extraction w/ intraocular lens  implant, bilateral  2006 and 2007    LEFT THEN RIGHT  . Retropubic prostatectomy  2005  . Prostate surgery  2005  . Eye surgery  2009    R AND L POST. CAPSULOTOMY for cloudy lens capsule    Family History  Problem Relation Age of Onset  . Asthma Mother   . Hearing loss Mother   . Cancer Father     gastric  . Cancer Brother     LEUKEMIA  . Hyperlipidemia Son   . Hypertension Son   . Cancer Brother     NON-HODGKIN'S LYMPHOMA, PROSTATE  . Asthma Brother   . Heart disease Brother     CHF  . Cancer Brother     NON-HODGKIN'S LYMPHOMA  . Depression Son     Social History History  Substance Use Topics  . Smoking status: Never Smoker   . Smokeless tobacco: Not on file  . Alcohol Use: No     Comment: INFREQUENTLY    Current Outpatient Prescriptions  Medication Sig Dispense Refill  . amLODipine (NORVASC) 5 MG tablet Take 5 mg by mouth daily.     Marland Kitchen aspirin 81 MG chewable tablet Chew 81 mg by mouth daily.    . budesonide-formoterol (SYMBICORT) 160-4.5 MCG/ACT inhaler Inhale 2 puffs into the lungs  2 (two) times daily.    . chlorthalidone (HYGROTON) 25 MG tablet Take 25 mg by mouth daily.    . cholecalciferol (VITAMIN D) 1000 UNITS tablet Take 2,000 Units by mouth daily.    Marland Kitchen doxazosin (CARDURA) 2 MG tablet Take 2 mg by mouth at bedtime.    . fexofenadine (ALLEGRA) 180 MG tablet Take 180 mg by mouth daily as needed for allergies or rhinitis.    . fluticasone (FLONASE) 50 MCG/ACT nasal spray Place 1 spray into the nose as needed for allergies.     Marland Kitchen losartan (COZAAR) 100 MG tablet Take 100 mg by mouth daily.     . Naphazoline-Pheniramine 0.027-0.315 % SOLN Apply 1 drop to eye 4 (four) times daily as needed. FOR ALLERGIC CONJUNCTIVITIS    . potassium chloride SA (K-DUR,KLOR-CON) 20 MEQ tablet Take 20 mEq by mouth once.    . pravastatin (PRAVACHOL) 20 MG tablet Take 20 mg by mouth  daily. PM PC    . PROAIR HFA 108 (90 BASE) MCG/ACT inhaler Inhale 2 puffs into the lungs every 4 (four) hours as needed for shortness of breath (FOR ASMTHA).      No current facility-administered medications for this visit.    Allergies  Allergen Reactions  . Codeine Nausea And Vomiting  . Hydrocodone Nausea Only  . Simvastatin     LIVER FUNCTION ABNORMALITIES    Review of Systems  Constitutional: Negative for appetite change and fatigue.  HENT: Positive for hearing loss.   Respiratory: Positive for chest tightness, shortness of breath and wheezing.   Cardiovascular: Positive for chest pain and palpitations.  Genitourinary:       Kidney stones  Neurological:       Memory loss  Hematological: Does not bruise/bleed easily.  Psychiatric/Behavioral: The patient is nervous/anxious.   All other systems reviewed and are negative.   BP 157/72 mmHg  Pulse 67  Resp 16  Ht 5\' 9"  (1.753 m)  Wt 185 lb (83.915 kg)  BMI 27.31 kg/m2  SpO2 99% Physical Exam  Constitutional: He is oriented to person, place, and time. He appears well-developed and well-nourished. No distress.  HENT:  Head: Normocephalic and atraumatic.  Eyes: EOM are normal. Pupils are equal, round, and reactive to light.  Neck: Neck supple. No thyromegaly present.  Cardiovascular: Normal rate, regular rhythm, normal heart sounds and intact distal pulses.   No murmur heard. Pulmonary/Chest: Effort normal and breath sounds normal. He has no wheezes.  Abdominal: Soft. There is no tenderness.  Musculoskeletal: He exhibits no edema.  Lymphadenopathy:    He has no cervical adenopathy.  Neurological: He is alert and oriented to person, place, and time. No cranial nerve deficit.  Skin: Skin is warm and dry.  Vitals reviewed.    Diagnostic Tests: CT CHEST WITH CONTRAST  TECHNIQUE: Multidetector CT imaging of the chest was performed during intravenous contrast administration.  CONTRAST: 98mL OMNIPAQUE IOHEXOL 300  MG/ML SOLN  COMPARISON: CT angio Coronary arteries of 11/11/2013  FINDINGS: On lung window images no lung parenchymal abnormality is seen. No nodule is noted and there is no evidence of pleural effusion. The central bronchi appear patent.  A small low attention left thyroid nodule is noted questionable significance in this age group. At the level of the sinuses of L cell with a aorta measures 39 mm compared to 38 mm previously. Within the mid ascending aorta the measurement is 48 mm compared to 44 mm previously. The descending thoracic aorta measures 27 mm compared to  25 mm previously. The main pulmonary artery measures 25 mm compared to 22 mm previously. Ascending thoracic aortic aneurysm. Recommend semi-annual imaging followup by CTA or MRA and referral to cardiothoracic surgery if not already obtained. This recommendation follows 2010 ACCF/AHA/AATS/ACR/ASA/SCA/SCAI/SIR/STS/SVM Guidelines for the Diagnosis and Management of Patients With Thoracic Aortic Disease. Circulation. 2010; 121: D322-G254.  No mediastinal or hilar adenopathy is seen. The pulmonary arteries opacify with no significant abnormality noted. The origins of the great vessels are patent. As noted previously coronary artery calcifications are identified. There is a small hiatal hernia present. Incidental gallstones are noted within the gallbladder with the larger stone measuring 1.8 cm in diameter.  IMPRESSION: 1. Prominent ascending aorta of 4.8 cm. Ascending thoracic aortic aneurysm. Recommend semi-annual imaging followup by CTA or MRA and referral to cardiothoracic surgery if not already obtained. This recommendation follows 2010 ACCF/AHA/AATS/ACR/ASA/SCA/SCAI/SIR/STS/SVM Guidelines for the Diagnosis and Management of Patients With Thoracic Aortic Disease. Circulation. 2010; 121: Y706-C376 . 2. Incidental gallstones. 3. Small hiatal hernia.   Electronically Signed  By: Ivar Drape M.D.  On:  12/16/2014 09:21   Impression: Mr. Kossman is a 74 year old gentleman with a 4.8 cm ascending aortic aneurysm. I personally reviewed his CT from January and compared side-by-side with his coronary CT from December 2014. I also reviewed the study side-by-side with Mr. and Mrs. Eye.  In comparing the diameter of the ascending aorta at the level of the bifurcation of the pulmonary artery (as high as the coronary CT went) there is been no change in size. The aorta measured 4.8 cm in 2014 as well. Comparing from that point down to the heart and there does not appear to be any change in the interim.  I long discussion with Mr. and Mrs. Valley regarding the implications of an ascending aortic aneurysm. We specifically talked about the risks of rupture and, more commonly, dissection. At this point his risk for either of those entities is lower than his risk for significant morbidity or mortality with surgery. Typically with ascending aneurysms the cutoff for surgery is 5.5-6 cm depending on the patient's size and other factors. This is still a serious problem and needs close follow-up and I would recommend that he be scanned again in 6 months.  I also emphasized the importance of blood pressure control. His blood pressure today was elevated at 157/72. I emphasized the importance his blood pressure below 283 systolic. He carefully checks his own blood pressure at home and says that it usually is lower than that.  I cautioned him to watch his salt intake.  I do not hear a murmur on exam. It would not be unreasonable to get a baseline echocardiogram to see if the has any underlying aortic valvular disease.  Regarding his chest pressure I do not think that's from the aneurysm. He did have some plaque in his coronaries in December 2014. It is possible that that is progressed. It is also possible it could be related to his asthma. It might be worth considering doing a stress Cardiolite or similar study to see if  there is any sign of ischemia.  He asked for referral to a cardiologist. I felt that would be more appropriate through Dr. Sandi Mariscal.  Plan: Return in 6 months with MRAngio of chest  Montez Morita.D.

## 2015-03-12 ENCOUNTER — Other Ambulatory Visit: Payer: Self-pay | Admitting: Family Medicine

## 2015-03-12 DIAGNOSIS — E041 Nontoxic single thyroid nodule: Secondary | ICD-10-CM

## 2015-03-14 IMAGING — CT CT CHEST W/ CM
2 of 3 series · 15 of 36 positions shown, 18 images · IV contrast (75CC OMNI 300)
Comparison: CT angio Coronary arteries of 11/11/2013

CLINICAL DATA: Evaluate dilatation of the ascending aorta noted
previously

EXAM:
CT CHEST WITH CONTRAST
TECHNIQUE: Multidetector CT imaging of the chest was performed during
intravenous contrast administration.
CONTRAST:  75mL OMNIPAQUE IOHEXOL 300 MG/ML  SOLN

[Series 2: chest with · axial · 0.70mm/px · z∈[-376,-40]mm · 12 of 79 slices shown, 15 images]
[im 6/79  mediastinal]
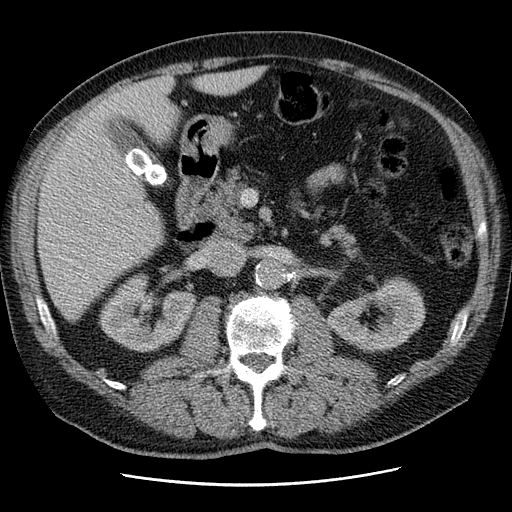
[im 6/79  lung]
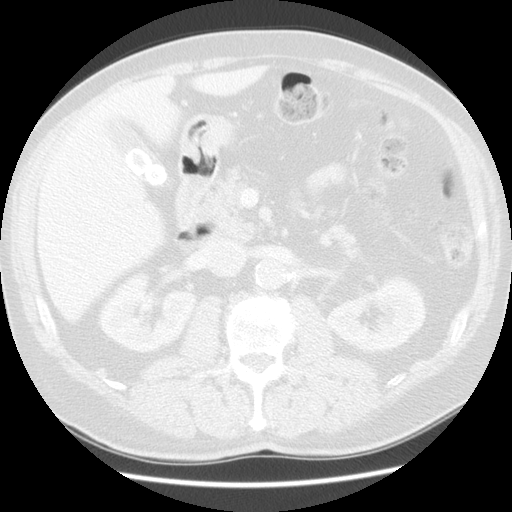
[im 12/79  lung]
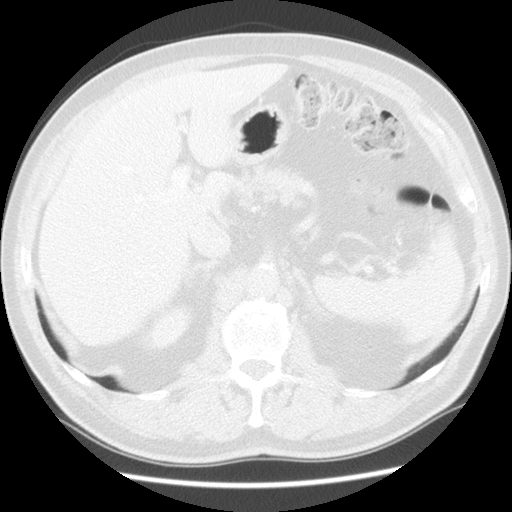
[im 18/79  lung]
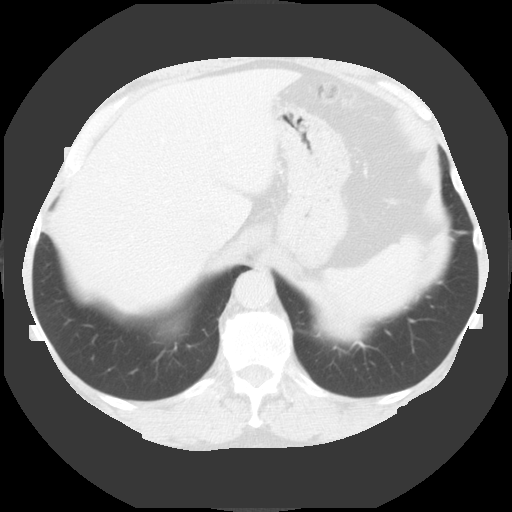
[im 24/79  lung]
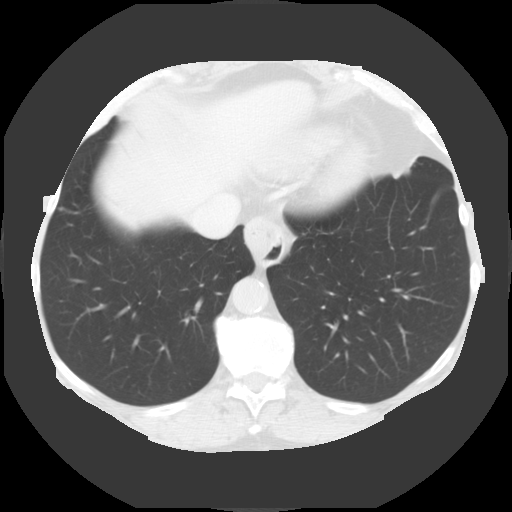
[im 29/79  mediastinal]
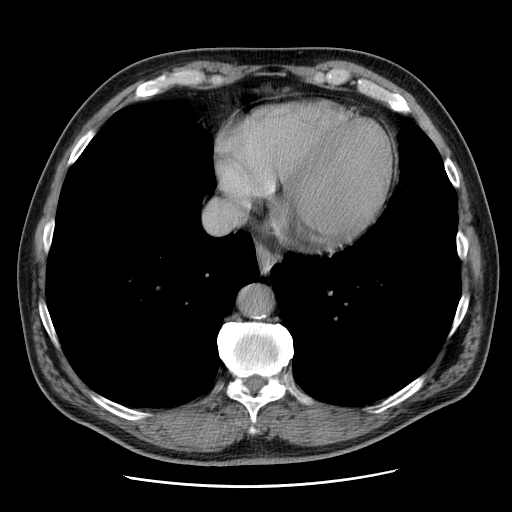
[im 29/79  lung]
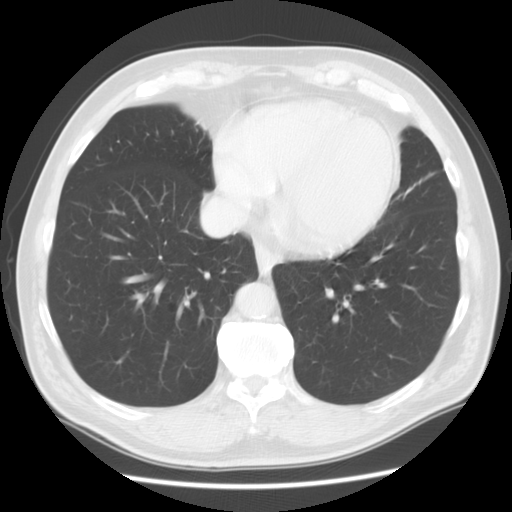
[im 35/79  lung]
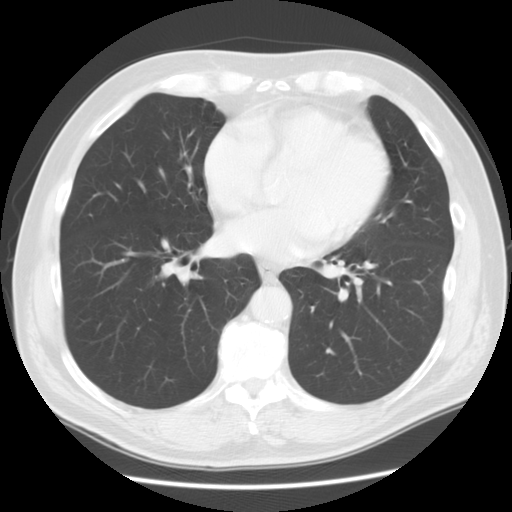
[im 44/79  lung]
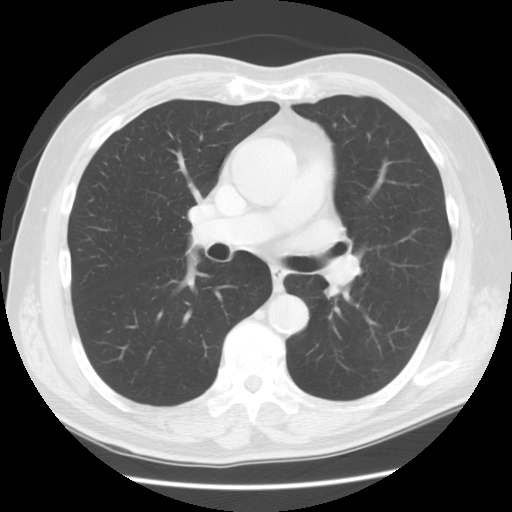
[im 50/79  lung]
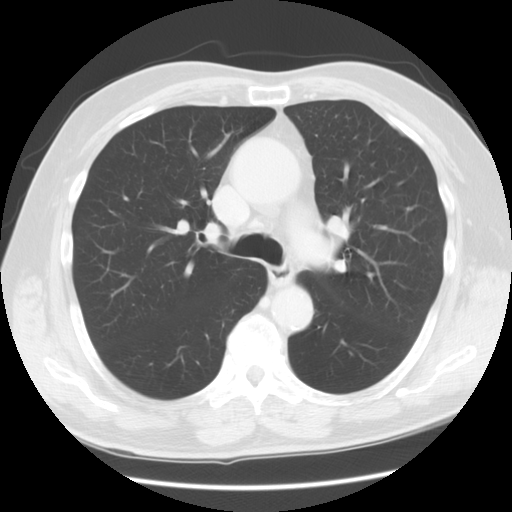
[im 55/79  mediastinal]
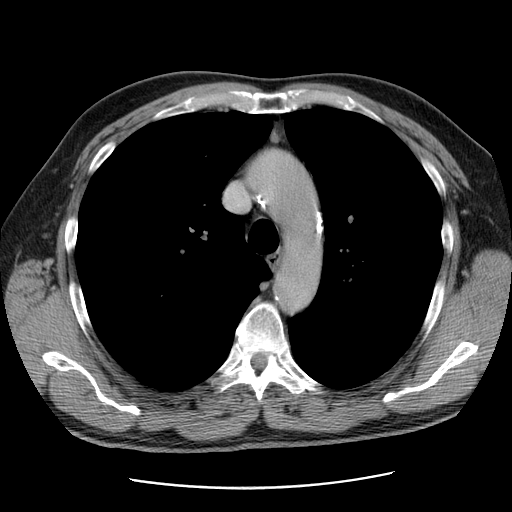
[im 55/79  lung]
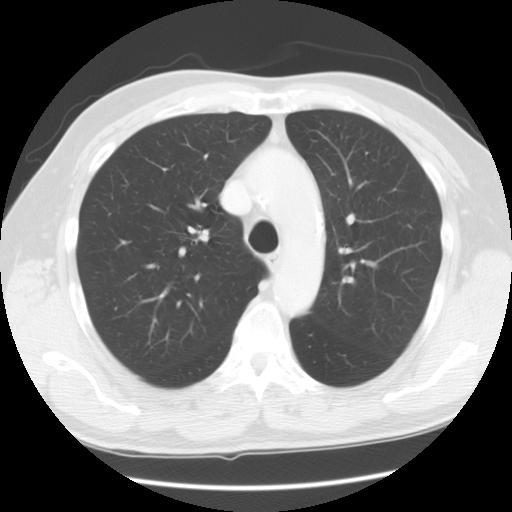
[im 61/79  lung]
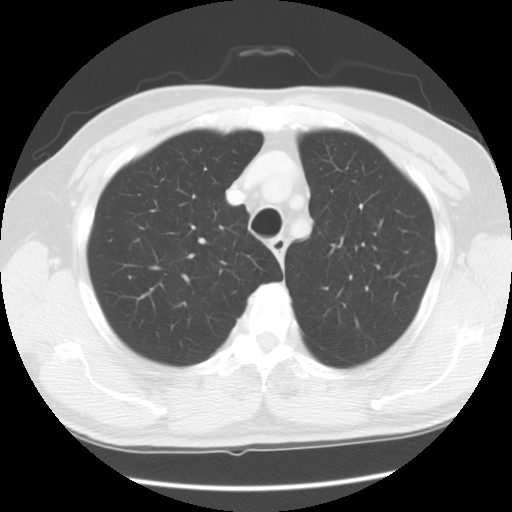
[im 67/79  lung]
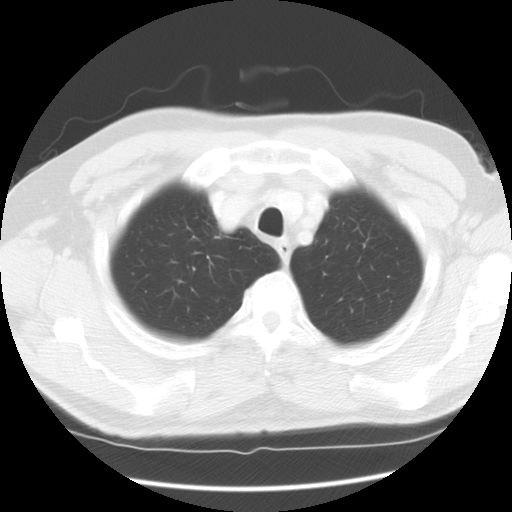
[im 73/79  lung]
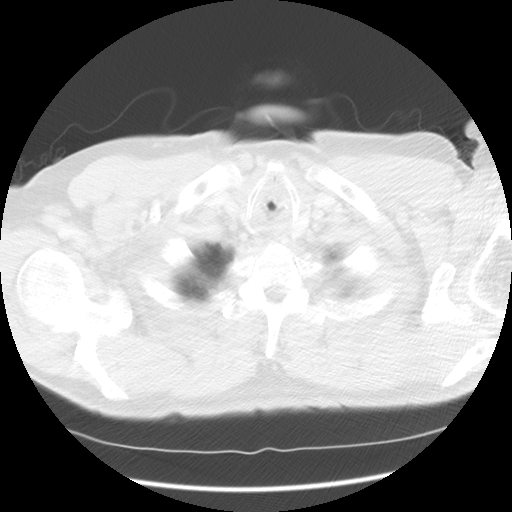

[Series 401: cor · coronal · 0.78mm/px · 3 of 148 slices shown]
[im 30/148  lung]
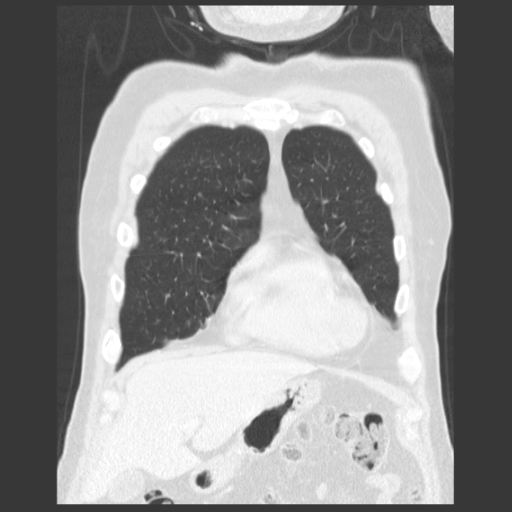
[im 59/148  lung]
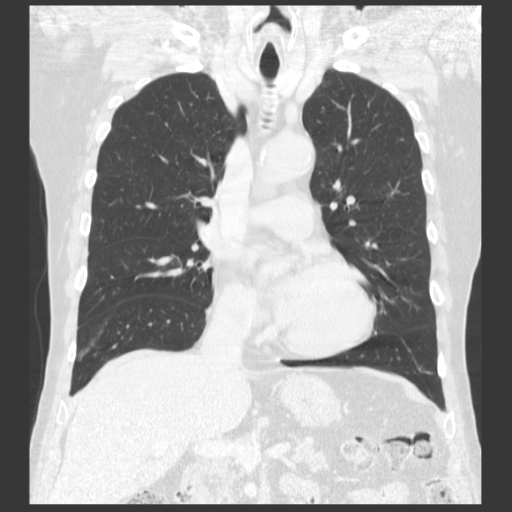
[im 89/148  lung]
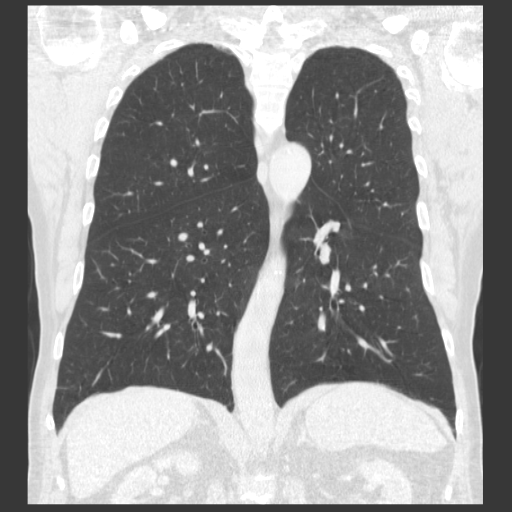

[15 of 36 positions shown; findings below may reference images not displayed]

FINDINGS: On lung window images no lung parenchymal abnormality is seen. No
nodule is noted and there is no evidence of pleural effusion. The
central bronchi appear patent.

A small low attention left thyroid nodule is noted questionable
significance in this age group. At the level of the sinuses of L
cell with a aorta measures 39 mm compared to 38 mm previously.
Within the mid ascending aorta the measurement is 48 mm compared to
44 mm previously. The descending thoracic aorta measures 27 mm
compared to 25 mm previously. The main pulmonary artery measures 25
mm compared to 22 mm previously. Ascending thoracic aortic aneurysm.
Recommend semi-annual imaging followup by CTA or MRA and referral to
cardiothoracic surgery if not already obtained. This recommendation
follows 6393 ACCF/AHA/AATS/ACR/ASA/SCA/CHAI/OKOSI/KAVINASH/HUNG HA Guidelines
for the Diagnosis and Management of Patients With Thoracic Aortic
Disease. Circulation. 6393; 121: e266-e369.

No mediastinal or hilar adenopathy is seen. The pulmonary arteries
opacify with no significant abnormality noted. The origins of the
great vessels are patent. As noted previously coronary artery
calcifications are identified. There is a small hiatal hernia
present. Incidental gallstones are noted within the gallbladder with
the larger stone measuring 1.8 cm in diameter.
IMPRESSION: 1. Prominent ascending aorta of 4.8 cm. Ascending thoracic aortic
aneurysm. Recommend semi-annual imaging followup by CTA or MRA and
referral to cardiothoracic surgery if not already obtained. This
recommendation follows 6393
ACCF/AHA/AATS/ACR/ASA/SCA/CHAI/OKOSI/KAVINASH/HUNG HA Guidelines for the
Diagnosis and Management of Patients With Thoracic Aortic Disease.
Circulation. 6393; 121: [id number] .
2. Incidental gallstones.
3. Small hiatal hernia.

## 2015-03-18 ENCOUNTER — Ambulatory Visit
Admission: RE | Admit: 2015-03-18 | Discharge: 2015-03-18 | Disposition: A | Discharge status: Home / Self Care | Payer: Medicare Other | Source: Ambulatory Visit | Attending: Family Medicine | Admitting: Family Medicine

## 2015-03-18 DIAGNOSIS — E041 Nontoxic single thyroid nodule: Secondary | ICD-10-CM

## 2015-03-24 ENCOUNTER — Other Ambulatory Visit: Payer: Self-pay | Admitting: Family Medicine

## 2015-03-24 DIAGNOSIS — E041 Nontoxic single thyroid nodule: Secondary | ICD-10-CM

## 2015-03-25 ENCOUNTER — Other Ambulatory Visit (HOSPITAL_COMMUNITY)
Admission: RE | Admit: 2015-03-25 | Discharge: 2015-03-25 | Disposition: A | Discharge status: Home / Self Care | Payer: Medicare Other | Source: Ambulatory Visit | Attending: Interventional Radiology | Admitting: Interventional Radiology

## 2015-03-25 ENCOUNTER — Ambulatory Visit
Admission: RE | Admit: 2015-03-25 | Discharge: 2015-03-25 | Disposition: A | Discharge status: Home / Self Care | Payer: Medicare Other | Source: Ambulatory Visit | Attending: Family Medicine | Admitting: Family Medicine

## 2015-03-25 DIAGNOSIS — E041 Nontoxic single thyroid nodule: Secondary | ICD-10-CM | POA: Insufficient documentation

## 2015-05-29 ENCOUNTER — Other Ambulatory Visit: Payer: Self-pay | Admitting: *Deleted

## 2015-05-29 DIAGNOSIS — I712 Thoracic aortic aneurysm, without rupture, unspecified: Secondary | ICD-10-CM

## 2015-06-21 IMAGING — US US THYROID BIOPSY
1 series · 13 of 21 positions shown · non-contrast
Comparison: 03/18/2015

CLINICAL DATA: Dominant left mid and lower pole nodules

EXAM:
ULTRASOUND GUIDED NEEDLE ASPIRATE BIOPSY OF THE THYROID GLAND

[Series 1: us thyroid biopsy · 0.07mm/px · 21 acquisitions, 13 frames shown]
[im 1/21]
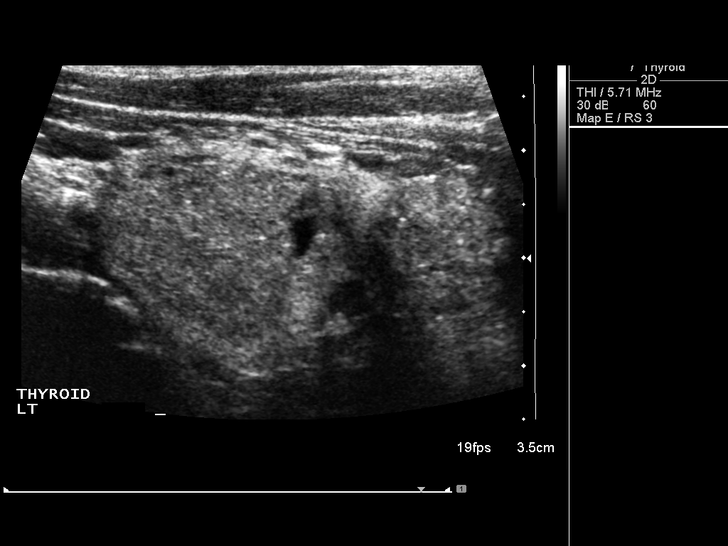
[im 3/21]
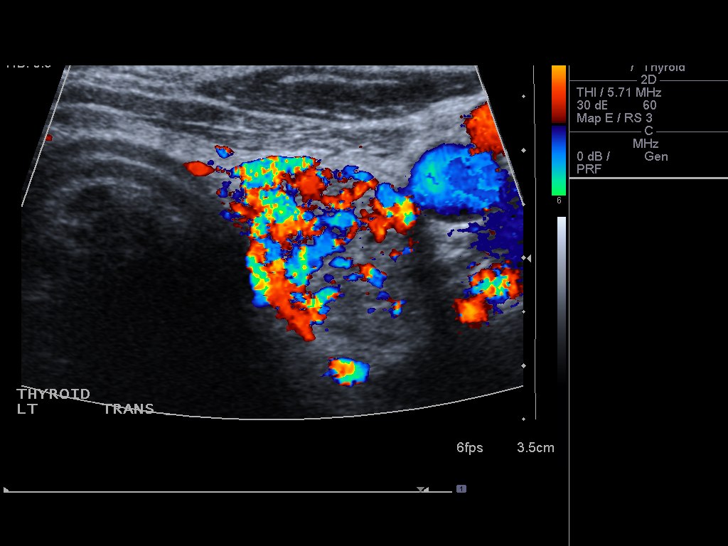
[im 5/21]
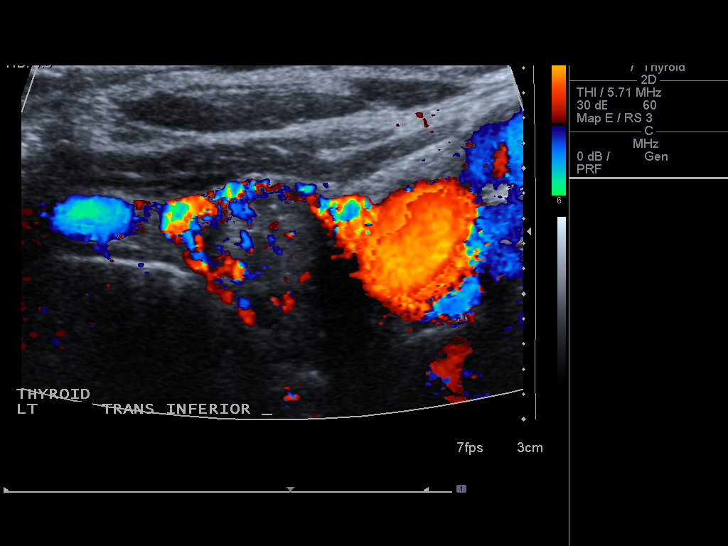
[im 6/21]
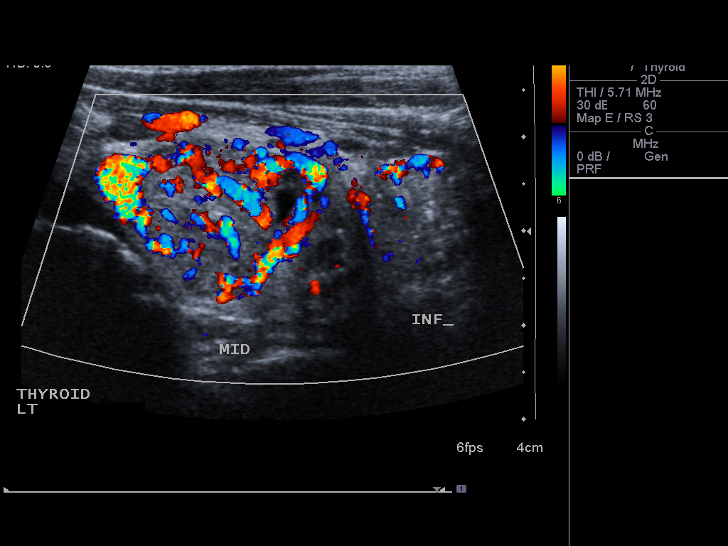
[im 8/21]
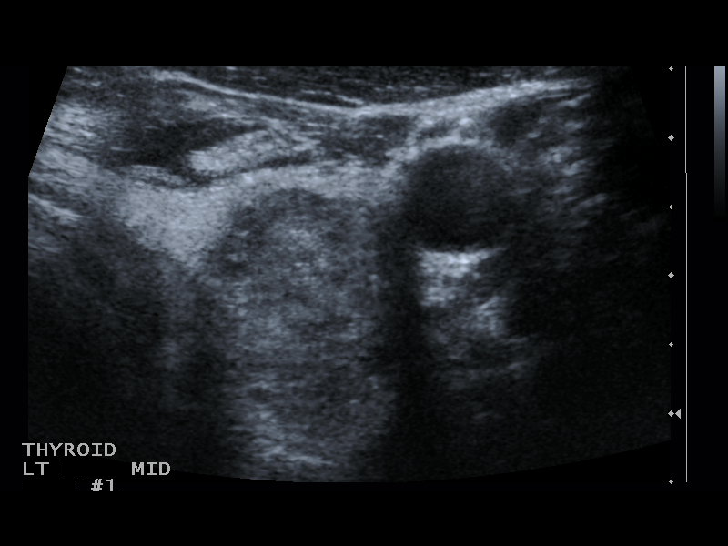
[im 9/21]
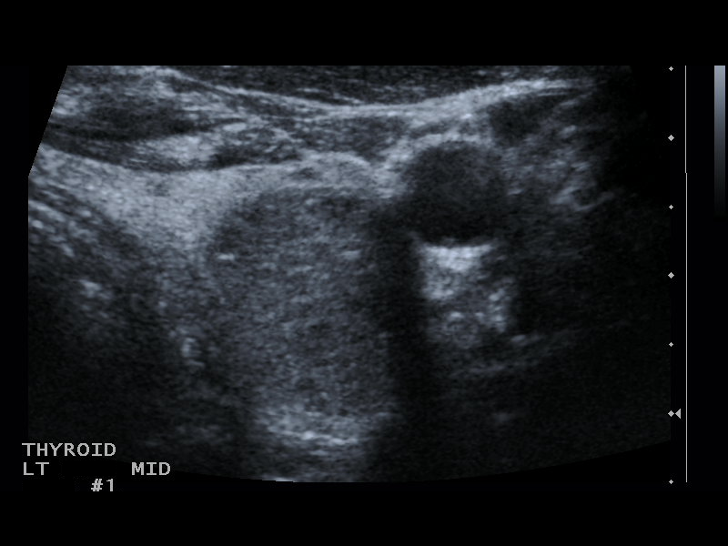
[im 11/21]
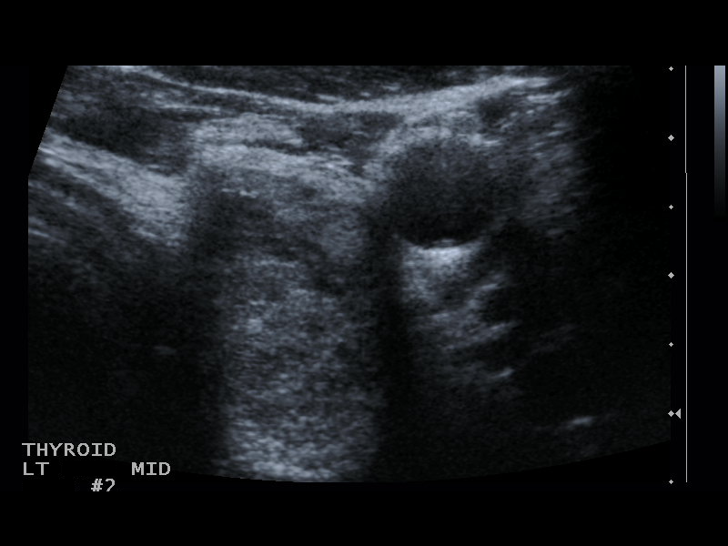
[im 13/21]
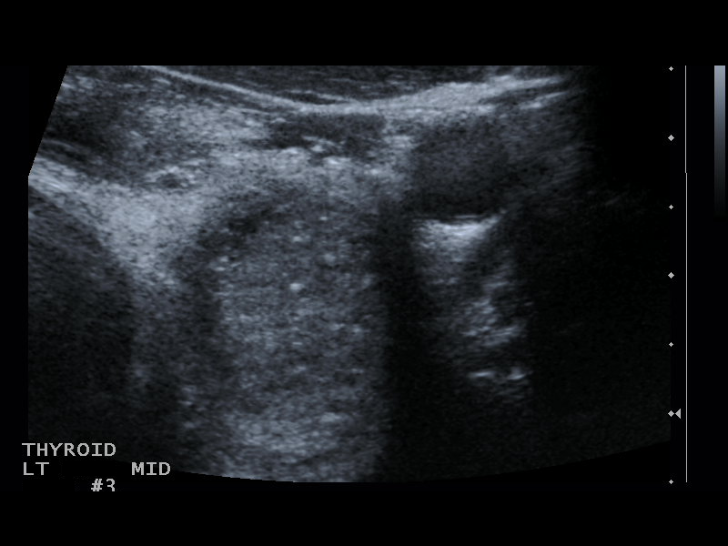
[im 14/21]
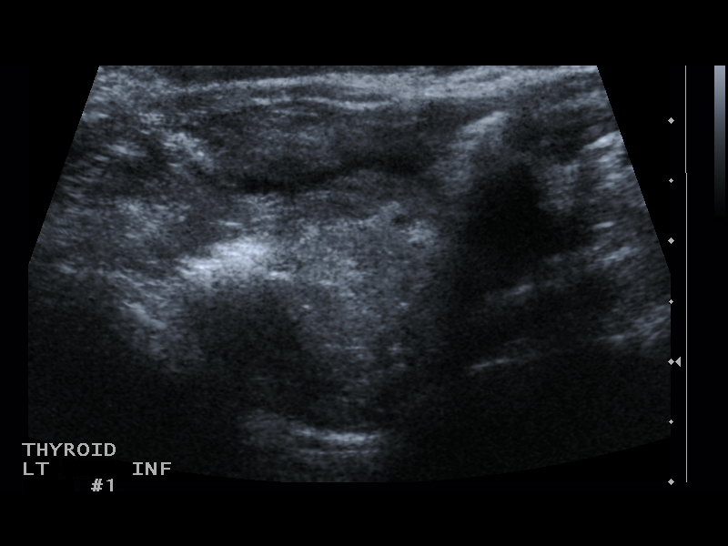
[im 16/21]
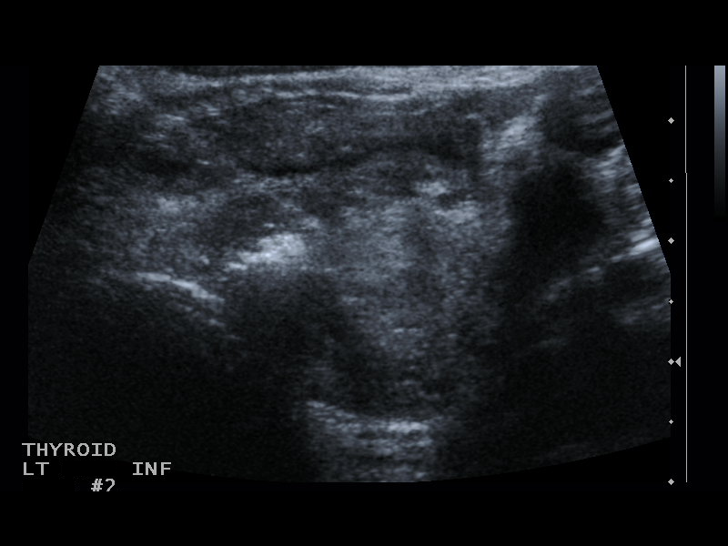
[im 17/21]
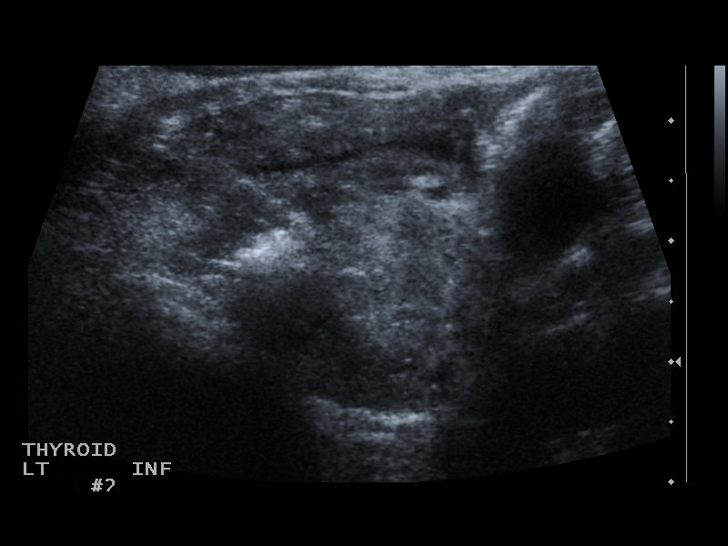
[im 19/21]
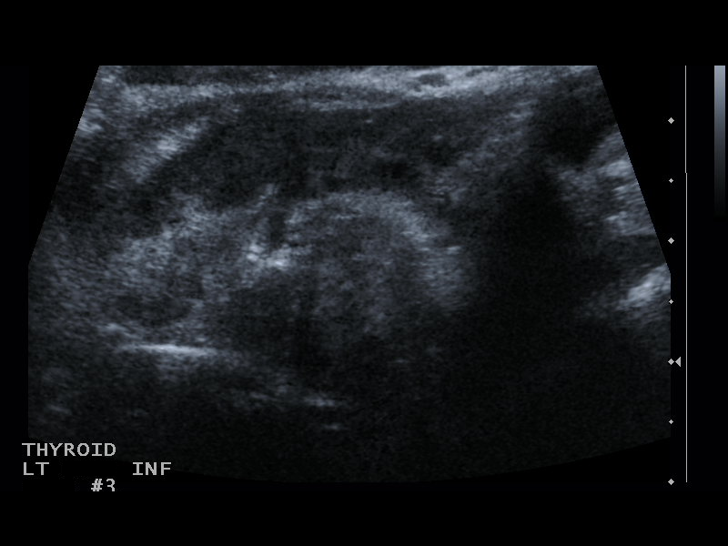
[im 21/21]
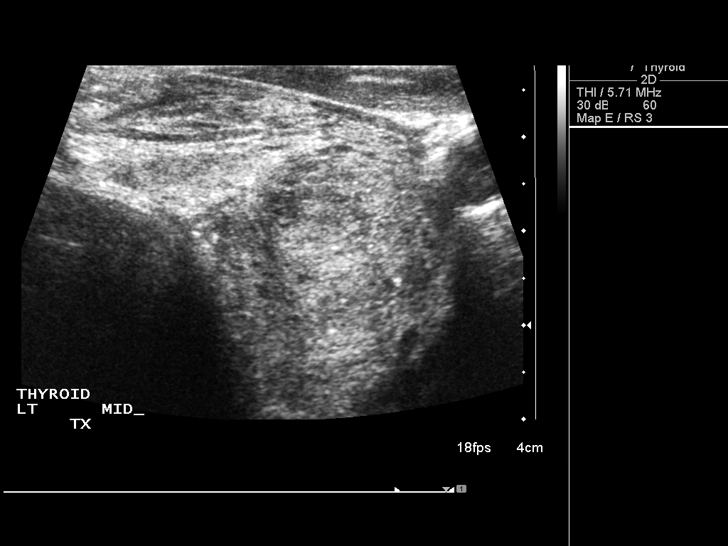

[13 of 21 positions shown; findings below may reference images not displayed]

PROCEDURE:
Thyroid biopsy was thoroughly discussed with the patient and
questions were answered. The benefits, risks, alternatives, and
complications were also discussed. The patient understands and
wishes to proceed with the procedure. Written consent was obtained.

Ultrasound was performed to localize and mark an adequate site for
the biopsy. The patient was then prepped and draped in a normal
sterile fashion. Local anesthesia was provided with 1% lidocaine.
Using direct ultrasound guidance, 3 passes were made using needles
into the lower pole nodule within the left lobe of the thyroid.
Ultrasound was used to confirm needle placements on all occasions.
Specimens were sent to Pathology for analysis.

Complications:  None immediate

Ultrasound was performed to localize and mark an adequate site for
the biopsy. The patient was then prepped and draped in a normal
sterile fashion. Local anesthesia was provided with 1% lidocaine.
Using direct ultrasound guidance, 3 passes were made using needles
into the midpole nodule within the left lobe of the thyroid.
Ultrasound was used to confirm needle placements on all occasions.
Specimens were sent to Pathology for analysis.

Complications:  None immediate
FINDINGS: Imaging confirms needle placed in the dominant left lower pole
nodule.

Imaging confirms needle placement in the dominant left midpole
nodule
IMPRESSION: Ultrasound guided needle aspirate biopsy performed of the dominant
left lower pole thyroid nodule.

Ultrasound guided needle aspirate biopsy performed of the dominant
left midpole thyroid nodule.

## 2015-07-02 LAB — BUN: BUN: 26 mg/dL — ABNORMAL HIGH (ref 7–25)

## 2015-07-02 LAB — CREATININE, SERUM: CREATININE: 1.1 mg/dL (ref 0.70–1.18)

## 2015-07-07 ENCOUNTER — Encounter: Payer: Self-pay | Admitting: Thoracic Surgery (Cardiothoracic Vascular Surgery)

## 2015-07-07 ENCOUNTER — Ambulatory Visit
Admission: RE | Admit: 2015-07-07 | Discharge: 2015-07-07 | Disposition: A | Discharge status: Home / Self Care | Payer: Medicare Other | Source: Ambulatory Visit | Attending: Thoracic Surgery (Cardiothoracic Vascular Surgery) | Admitting: Thoracic Surgery (Cardiothoracic Vascular Surgery)

## 2015-07-07 ENCOUNTER — Ambulatory Visit (INDEPENDENT_AMBULATORY_CARE_PROVIDER_SITE_OTHER): Payer: Medicare Other | Admitting: Thoracic Surgery (Cardiothoracic Vascular Surgery)

## 2015-07-07 VITALS — BP 132/70 | HR 55 | Resp 16 | Ht 69.0 in | Wt 185.0 lb

## 2015-07-07 DIAGNOSIS — I712 Thoracic aortic aneurysm, without rupture, unspecified: Secondary | ICD-10-CM

## 2015-07-07 MED ORDER — GADOBENATE DIMEGLUMINE 529 MG/ML IV SOLN
17.0000 mL | Freq: Once | INTRAVENOUS | Status: AC | PRN
  Administered 2015-07-07: 17 mL via INTRAVENOUS

## 2015-07-07 NOTE — Progress Notes (Signed)
[address]       [place] [address]             [phone]       HPI:  Mr. [name] returns today regarding his ascending aortic aneurysm. I first saw him 6 months ago when a CT of the chest showed a 4.8 cm ascending aneurysm. In retrospect this was unchanged from a CT in 2014.  Since his last visit he has been feeling well overall. He says he continues to have some mild discomfort in his chest. This is not consistently exertional. He will walk about 2 miles in the morning and not have any problems. He relates it to when he takes Symbicort.  Past Medical History  Diagnosis Date  . Hypertension   . Thoracic aortic aneurysm without rupture   . GERD (gastroesophageal reflux disease)   . Anxiety   . Prostate cancer 2005/2011    RADIATION  . Asthma     CHRONIC PERSISTENT      Current Outpatient Prescriptions  Medication Sig Dispense Refill  . amLODipine (NORVASC) 5 MG tablet Take 5 mg by mouth daily.     Marland Kitchen aspirin 81 MG chewable tablet Chew 81 mg by mouth daily.    . budesonide-formoterol (SYMBICORT) 160-4.5 MCG/ACT inhaler Inhale 2 puffs into the lungs 2 (two) times daily.    . cholecalciferol (VITAMIN D) 1000 UNITS tablet Take 2,000 Units by mouth daily.    Marland Kitchen doxazosin (CARDURA) 2 MG tablet Take 2 mg by mouth at bedtime.    Marland Kitchen losartan (COZAAR) 100 MG tablet Take 100 mg by mouth daily.     . Naphazoline-Pheniramine 0.027-0.315 % SOLN Apply 1 drop to eye 4 (four) times daily as needed. FOR ALLERGIC CONJUNCTIVITIS    . pravastatin (PRAVACHOL) 20 MG tablet Take 20 mg by mouth daily. PM PC    . PROAIR HFA 108 (90 BASE) MCG/ACT inhaler Inhale 2 puffs into the lungs every 4 (four) hours as needed for shortness of breath (FOR ASMTHA).      No current facility-administered medications for this visit.    Physical Exam BP 132/70 mmHg  Pulse 55  Resp 16  Ht 5\' 9"  (1.753 m)  Wt 185 lb (83.915 kg)  BMI 27.31 kg/m2  SpO4 67% 74 year old man in no acute  distress Well-developed and well-nourished Alert and oriented 3 with no focal deficits Lungs clear with equal breath sounds bilaterally No carotid bruits Cardiac regular rate and rhythm with a 2/6 systolic murmur  Diagnostic Tests:  MRA CHEST WITH OR WITHOUT CONTRAST  TECHNIQUE: Angiographic images of the chest were obtained using MRA technique without and with intravenous contrast.  CONTRAST: 50mL MULTIHANCE GADOBENATE DIMEGLUMINE 529 MG/ML IV SOLN  COMPARISON: CT of the chest on 12/16/2014 and coronary CTA on 11/13/2013.  FINDINGS: There is stable mild dilatation of the ascending thoracic aorta. The aorta measures approximately 4 cm at the level of the sinuses of Valsalva. Maximal caliber of the ascending thoracic aorta is 4.6 cm. The proximal arch measures 3.6 cm. The distal arch measures 3.0 cm. The descending thoracic aorta measures 2.7 cm. No evidence of aortic dissection or intramural hemorrhage. Proximal great vessels show normal patency and branching anatomy.  The heart size is normal. No pleural or pericardial fluid is identified. No incidental mass lesions or lymphadenopathy detected.  IMPRESSION: Stable mild aneurysmal disease of the ascending thoracic aorta. Maximal caliber is 4.6 cm. No dilatation of the sinuses of Valsalva. No  evidence of aortic dissection.   Electronically Signed  By: Aletta Edouard M.D.  On: 07/07/2015 10:53      I personally reviewed the MRA images and compared them to his CT from February. I concur with the findings as noted above  Impression: 74 year old man with a 4.6 cm ascending aortic aneurysm that is unchanged over the past 6 months. I recommended continued every 6 month follow-up.  His blood pressure is well controlled today.  He is planning to move to [facility] in the near future. I do not know anyone to directly refer him to out there. I recommended that he establish with a primary care first and then  get a recommendation for a cardiothoracic surgeon from the primary care M.D.  Plan: Return in 6 months with an MR angiogram. If he does move to [place] will be happy to forward any records.  I spent 10 minutes with Mr. Inghram during this visit. Greater than 50% of the time was counseling.  Melrose Nakayama, MD [facility] [phone]
